# Patient Record
Sex: Female | Born: 1970 | Race: White | Hispanic: No | Smoking: Current every day smoker
Health system: Southern US, Community
[De-identification: ages and names within clinical notes are randomized; demographics above are authoritative.]

## PROBLEM LIST (undated history)

## (undated) HISTORY — PX: CHOLECYSTECTOMY: SHX55

## (undated) HISTORY — PX: TUBAL LIGATION: SHX77

## (undated) HISTORY — PX: TONSILLECTOMY: SUR1361

## (undated) HISTORY — PX: SPINE SURGERY: SHX786

## (undated) HISTORY — PX: BACK SURGERY: SHX140

## (undated) HISTORY — PX: APPENDECTOMY: SHX54

---

## 2015-01-12 ENCOUNTER — Emergency Department (HOSPITAL_COMMUNITY)
Admission: EM | Admit: 2015-01-12 | Discharge: 2015-01-12 | Disposition: A | Discharge status: Home / Self Care | Payer: Self-pay | Attending: Emergency Medicine | Admitting: Emergency Medicine

## 2015-01-12 ENCOUNTER — Encounter (HOSPITAL_COMMUNITY): Payer: Self-pay | Admitting: Emergency Medicine

## 2015-01-12 DIAGNOSIS — K029 Dental caries, unspecified: Secondary | ICD-10-CM | POA: Insufficient documentation

## 2015-01-12 DIAGNOSIS — Z720 Tobacco use: Secondary | ICD-10-CM | POA: Insufficient documentation

## 2015-01-12 DIAGNOSIS — K088 Other specified disorders of teeth and supporting structures: Secondary | ICD-10-CM | POA: Insufficient documentation

## 2015-01-12 DIAGNOSIS — K0889 Other specified disorders of teeth and supporting structures: Secondary | ICD-10-CM

## 2015-01-12 DIAGNOSIS — K0381 Cracked tooth: Secondary | ICD-10-CM | POA: Insufficient documentation

## 2015-01-12 MED ORDER — PENICILLIN V POTASSIUM 500 MG PO TABS: 500.0000 mg | ORAL_TABLET | Freq: Four times a day (QID) | ORAL | Status: AC

## 2015-01-12 MED ORDER — TRAMADOL HCL 50 MG PO TABS: 50.0000 mg | ORAL_TABLET | Freq: Four times a day (QID) | ORAL | Status: DC | PRN

## 2015-01-12 MED ORDER — BUPIVACAINE-EPINEPHRINE (PF) 0.5% -1:200000 IJ SOLN
1.8000 mL | Freq: Once | INTRAMUSCULAR | Status: AC
  Administered 2015-01-12: 1.8 mL
  Filled 2015-01-12: qty 1.8

## 2015-01-12 MED ORDER — NAPROXEN 500 MG PO TABS: 500.0000 mg | ORAL_TABLET | Freq: Two times a day (BID) | ORAL | Status: DC

## 2015-01-12 NOTE — ED Notes (Signed)
Pt states that she has a R upper tooth that broke off that is now causing her pain. Alert and oriented.

## 2015-01-12 NOTE — Discharge Instructions (Signed)
Please follow directions provided. Be sure to follow up with the dentist referral listed for further management of this tooth pain. Please take the antibiotic as directed until it all gone. Please take the naproxen twice a day to help with pain and inflammation. You may use the Ultram for pain not relieved by the naproxen. Don't hesitate to return for any new, worsening, or concerning symptoms.   SEEK IMMEDIATE MEDICAL CARE IF:  You have a fever.  You develop redness and swelling of your face, jaw, or neck.  You are unable to open your mouth.  You have severe pain uncontrolled by pain medicine.    Emergency Department Resource Guide 1) Find a Doctor and Pay Out of Pocket Although you won't have to find out who is covered by your insurance plan, it is a good idea to ask around and get recommendations. You will then need to call the office and see if the doctor you have chosen will accept you as a new patient and what types of options they offer for patients who are self-pay. Some doctors offer discounts or will set up payment plans for their patients who do not have insurance, but you will need to ask so you aren't surprised when you get to your appointment.  2) Contact Your Local Health Department Not all health departments have doctors that can see patients for sick visits, but many do, so it is worth a call to see if yours does. If you don't know where your local health department is, you can check in your phone book. The CDC also has a tool to help you locate your state's health department, and many state websites also have listings of all of their local health departments.  3) Find a [facility] If your illness is not likely to be very severe or complicated, you may want to try a walk in clinic. These are popping up all over the country in pharmacies, drugstores, and shopping centers. They're usually staffed by nurse practitioners or physician assistants that have been trained to treat  common illnesses and complaints. They're usually fairly quick and inexpensive. However, if you have serious medical issues or chronic medical problems, these are probably not your best option.  No Primary Care Doctor: - Call Health Connect at  [phone] - they can help you locate a primary care doctor that  accepts your insurance, provides certain services, etc. - Physician Referral Service- [phone]  Chronic Pain Problems: Organization         Address  Phone   Notes  [facility]  [phone] Patients need to be referred by their primary care doctor.   Medication Assistance: Organization         Address  Phone   Notes  [facility] [address] [place], [place] [address] [phone] --Must be a resident of [facility] -- Must have NO insurance coverage whatsoever (no Medicaid/ Medicare, etc.) -- The pt. MUST have a primary care doctor that directs their care regularly and follows them in the community   [facility]ist  [phone]   [employer]  [phone]    Agencies that provide inexpensive medical care: Organization         Address  Phone   Notes  [place]  [phone]   [facility]    [phone]   [facility] [place], [place] [address] [phone]  [place] [address], [place] [phone]   Planned Parenthood    [phone]   [facility]    [phone]   [place] and [address], [place] Phone:  [phone], Fax:  [phone] Hours of Operation:  9 am - 6 pm, M-F.  Also accepts Medicaid/Medicare and self-pay.  [facility] for [place] [address] [place], [address], [place] Phone: [phone], Fax: [phone]. Hours of Operation:  8:30 am - 5:30 pm, M-F.  Also accepts Medicaid and self-pay.  [facility] [address], [place] Phone: [phone]   [place], [place], [place] [phone], Ext. [phone] Mondays & Thursdays: 7-9 AM.  First 15 patients are seen on a first come, first serve basis.    [place] Providers:  Organization         Address  Phone   Notes  [facility] [address], [place] [phone] Also accepts self-pay patients.  [facility] [address], [place]  [phone]   [place], [address], [place] [phone]   [facility] [address] [place] [phone]   Lucianne Lei [address], [place]   [phone] Only accepts [place] Access [place] patients after they have their name applied to their card.   Self-Pay (no insurance) in [facility]:  Organization         Address  Phone   Notes  Sickle Cell Patients, [facility] Internal [facility] [place] [phone]   [facility] [place] [phone]   [facility] [place]  [place], [place], [place] [phone]   [facility]/Dr. Osei-Bonsu  [address] [place] or [address], [place] [phone] Phone number for both [place] and [place] locations is the same.  Urgent Medical and [facility] [address] [place] [phone]   [facility] [address], [place] or [address] [phone]   [facility] [address], [place] [phone], phone; [phone], fax Sees patients 1st and 3rd Saturday of every month.  Must not qualify for public or private insurance (i.e. Medicaid, Medicare, [place] Health Choice, Veterans' Benefits)  Household income should be no more than 200% of the  poverty level The clinic cannot treat you if you are pregnant or think you are pregnant  Sexually transmitted diseases are not treated at the clinic.    Dental Care: Organization         Address  Phone  Notes  [facility] [place] [phone] Accepts children up to age 31 who are enrolled in [place] or [place]; pregnant women with a Medicaid card; and children who have applied for Medicaid or [place] Health Choice, but were declined, whose parents can pay a reduced fee at time of service.  [facility]  [address], [place] [phone] Accepts children up to age 69 who are enrolled in [place] or [place]; pregnant women with a  Medicaid card; and children who have applied for Medicaid or [place] Health Choice, but were declined, whose parents can pay a reduced fee at time of service.  [place]  [place] [phone] Patients are seen by appointment only. Walk-ins are not accepted. [place] will see patients 15 years of age and older. Monday - Tuesday (8am-5pm) Most Wednesdays (8:30-5pm) $30 per visit, cash only  [facility] Adult Dental Access PROGRAM  [address], [facility] [phone] Patients are seen by appointment only. Walk-ins are not accepted. [place] will see patients 78 years of age and older. One Wednesday Evening (Monthly: Volunteer Based).  $30 per visit, cash only  [place]  [phone] for adults; Children under age 24, call Graduate Pediatric Dentistry at [phone]. Children aged 31-14, please call [phone] to request a pediatric application.  Dental services are provided in all areas of dental care including fillings, crowns and bridges, complete and partial dentures, implants, gum treatment, root canals, and extractions.  Preventive care is also provided. Treatment is provided to both adults and children. Patients are selected via a lottery and there is often a waiting list.   [facility] [address] [place]  [phone] [email or web address]   [facility] [address] [place], [place] [phone], Ext. [phone] Second and Fourth Thursday of each month, opens at 6:30 AM; Clinic ends at 9 AM.  Patients are seen on a first-come first-served basis, and a limited number are seen during each clinic.   [facility]  [address] Hillard Danker [place], [place] [phone]   Eligibility Requirements You must have lived in [place], [place], or [place] counties for at least the last three months.   You cannot be eligible for state or federal sponsored [employer], including [employer], [place], or [employer].   You generally cannot be eligible for healthcare insurance through your employer.    How to apply: Eligibility screenings are held every Tuesday and Wednesday afternoon from 1:00 pm until 4:00 pm. You do not need an appointment for the interview!  [place] [address], [place], [place]   [place]  [place] Department  [place]  [phone]    Behavioral Health Resources in the Community: Intensive Outpatient Programs Organization         Address  Phone  Notes  [place]. [address] [place], [place] [phone]   [facility] [address] [place], [place]   ADS: Alcohol & Drug Svcs [address] [place], [place]   [place] [address],  [place], [place] or [phone]   Substance Abuse Resources Organization         Address  Phone  Notes  Alcohol and Drug Services  [phone]   [place]  [phone]   The [place]   Chinita Pester  [phone]   Residential & Outpatient Substance Abuse Program  [phone]   Psychological Services Organization         Address  Phone  Notes  [facility] [place]  [place]  [phone]   [place] [address] [place] or [phone]    Mobile Crisis Teams Organization  Address  Phone  Notes  [facility], Mobile Crisis Care Unit  [phone]   [facility]ive Psychotherapeutic Services  [address] [place], [place]   [facility] [address] [place] [phone]    Self-Help/Support Groups Organization         Address  Phone             Notes  [place]. of [place] - variety of support groups  [place] Call for more information  Narcotics Anonymous (NA), [facility]aring [facility] [address], [employer] [place]  2 meetings at this location   Special educational needs teacher         Address  Phone  Notes  [facility] [place],    [place]  [phone]   [facility]  [address] [place] [address], [place], [place]   [facility], [place] [phone] Admissions: 8am-3pm M-F  Incentives Substance [place] [address]    [place], [place] [phone]   [facility] [address] [place], [place], [place]   [facility] [address]  [place], [place]   [facility]ight Programs - Intensive Outpatient [address] Kristeen [address], [place], [place]   [facility] ([place].) [place].,  [place], [place] [phone] or [phone]   [facility]idential Treatment Services (RTS) [address] [place], [place] Accepts Medicaid  [facility] [place] [address]  [place]  [phone] Substance Abuse/Addiction Treatment   [facility] Organization         Address  Phone  Notes  [facility]  [phone]   Domenic Schwab, PhD [address] Arlis [place], [place]   [phone] or [phone]   [place], [place] [phone]   [facility] [address] [place], [place] [phone] Insurance/Medicaid/sponsorship through [facility] and Families [address], [address] [place]                                    [place], [place] [phone] [place] [address], [place] [phone]    Dr. Adele Schilder  [phone]   [facility] [place] Dept. 1) [address], [place] 2) [place] 3)  [place] [address], [place] [phone]  [phone]   [place] [phone] or [phone] (After Hours)

## 2015-01-12 NOTE — ED Provider Notes (Signed)
CSN: [id number]     Arrival date & time 01/12/15  2017 History  This chart was scribed for Britt Bottom, NP, working with Pamella Pert, MD by Steva Colder, ED Scribe. The patient was seen in room WTR6/WTR6 at 8:53 PM.   Chief Complaint  Patient presents with  . Dental Pain      The history is provided by the patient. No language interpreter was used.    HPI Comments: Julia Wilcox is a 44 y.o. female who presents to the Emergency Department complaining of right upper dental pain onset 2 days. Pt notes that she has a tooth that is broken off and it is now causing her to have pain. Pt reports that her nerves from the tooth is now showing. Pt has had pain to this tooth in the past but she has not had pain that was this intense. Pt reports that she just moved here from [place] and she doesn't have a local dentist. Pt rates her pain as 7-8/10.  She denies fever, chills, congestion, cough, facial swelling, and any other symptoms.    History reviewed. No pertinent past medical history. Past Surgical History  Procedure Laterality Date  . Back surgery    . Appendectomy    . Cholecystectomy    . Tonsillectomy    . Tubal ligation     No family history on file. History  Substance Use Topics  . Smoking status: Current Every Day Smoker  . Smokeless tobacco: Not on file  . Alcohol Use: No   OB History    No data available     Review of Systems  Constitutional: Negative for fever and chills.  HENT: Positive for dental problem. Negative for congestion, ear pain and facial swelling.       Allergies  Dilaudid  Home Medications   Prior to Admission medications   Not on File   BP 143/94 mmHg  Pulse 88  Temp(Src) 98.8 F (37.1 C) (Oral)  SpO2 100%  LMP 01/06/2015 (Approximate)  Physical Exam  Constitutional: She is oriented to person, place, and time. She appears well-developed and well-nourished. No distress.  HENT:  Head: Normocephalic and atraumatic.  Fracture  to the anterior aspect of the right upper second molar. No pulp or nerve noted.  Eyes: Conjunctivae are normal.  Neck: Neck supple. No tracheal deviation present.  Cardiovascular: Normal rate.   Pulmonary/Chest: Effort normal. No respiratory distress.  Musculoskeletal: Normal range of motion.  Neurological: She is alert and oriented to person, place, and time.  Skin: Skin is warm and dry.  Psychiatric: She has a normal mood and affect. Her behavior is normal.  Nursing note and vitals reviewed.   ED Course  Procedures (including critical care time) DIAGNOSTIC STUDIES: Oxygen Saturation is 100% on RA, nl by my interpretation.    COORDINATION OF CARE: 8:57 PM-Discussed treatment plan which includes abx Rx, anti-inflammatory Rx, pain medication Rx, and referral to dentist with pt at bedside and pt agreed to plan.   Labs Review Labs Reviewed - No data to display  Imaging Review No results found.   EKG Interpretation None      MDM   Final diagnoses:  Pain, dental   44 yo  with toothache, dental caries and dental fracture.  No indication of abscess Ludwig's angina or spread of infection.  Pain resolved after dental block. Prescription for abx, NSAIDs and ultram provided. Pt is well-appearing, in no acute distress and vital signs reviewed and not concerning. She appears safe  to be discharged.  Discharge include follow-up with dental referral. Return precautions provided. Pt aware of plan and in agreement.   I personally performed the services described in this documentation, which was scribed in my presence. The recorded information has been reviewed and is accurate.  Filed Vitals:   01/12/15 2026  BP: 143/94  Pulse: 88  Temp: 98.8 F (37.1 C)  TempSrc: Oral  SpO2: 100%   Meds given in ED:  Medications  bupivacaine-epinephrine (MARCAINE W/ EPI) 0.5% -1:200000 injection 1.8 mL (1.8 mLs Infiltration Given by Other 01/12/15 2131)    Discharge Medication List as of 01/12/2015   9:55 PM    START taking these medications   Details  naproxen (NAPROSYN) 500 MG tablet Take 1 tablet (500 mg total) by mouth 2 (two) times daily., Starting 01/12/2015, Until Discontinued, Print    penicillin v potassium (VEETID) 500 MG tablet Take 1 tablet (500 mg total) by mouth 4 (four) times daily., Starting 01/12/2015, Until Wed 01/19/15, Print    traMADol (ULTRAM) 50 MG tablet Take 1 tablet (50 mg total) by mouth every 6 (six) hours as needed., Starting 01/12/2015, Until Discontinued, Print          Britt Bottom, NP 01/13/15 [place], MD 01/13/15 [phone]

## 2017-10-18 ENCOUNTER — Ambulatory Visit (INDEPENDENT_AMBULATORY_CARE_PROVIDER_SITE_OTHER): Payer: Managed Care, Other (non HMO) | Admitting: Physician Assistant

## 2017-10-18 ENCOUNTER — Encounter: Payer: Self-pay | Admitting: Physician Assistant

## 2017-10-18 ENCOUNTER — Other Ambulatory Visit: Payer: Self-pay

## 2017-10-18 VITALS — BP 102/60 | HR 104 | Temp 98.2°F | Resp 18 | Ht 64.0 in | Wt 159.2 lb

## 2017-10-18 DIAGNOSIS — G479 Sleep disorder, unspecified: Secondary | ICD-10-CM

## 2017-10-18 DIAGNOSIS — Z91030 Bee allergy status: Secondary | ICD-10-CM | POA: Insufficient documentation

## 2017-10-18 DIAGNOSIS — R630 Anorexia: Secondary | ICD-10-CM

## 2017-10-18 DIAGNOSIS — F4321 Adjustment disorder with depressed mood: Secondary | ICD-10-CM | POA: Diagnosis not present

## 2017-10-18 MED ORDER — CLONAZEPAM 0.5 MG PO TABS: 0.2500 mg | ORAL_TABLET | Freq: Two times a day (BID) | ORAL | 0 refills | Status: DC | PRN

## 2017-10-18 NOTE — Progress Notes (Signed)
   Julia Wilcox  MRN: [id number] DOB: 1970-09-22  PCP: No primary care provider on file.  Chief Complaint  Patient presents with  . Establish Care    discuss counseling from husband passing away     Subjective:  Pt presents to clinic for concerns about not being able to sleep or eat since her husband died unexpectedly on 10/21/2022.  She woke up in the am and found her husband dead in the hallway from a massive heart attack.  She called 911 and they had her perform CPR on him.  Oliver Pila - weekly appts - set up through [employer] at work and that has helped.  She has FMLA paperwork and STD disability paperwork that Butch Penny has filled out that she also needs to be sign.  She tried to work for a few days but they it got to much - she feels supported by work family.  She has no family in the area.  Few friends outside of work.    History is obtained by patient.  Review of Systems  Constitutional: Positive for appetite change (decreased).  Psychiatric/Behavioral: Positive for sleep disturbance.    Patient Active Problem List   Diagnosis Date Noted  . 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 EPINEPHrine (EPIPEN IJ) Inject as directed.     No current facility-administered medications on file prior to visit.     Allergies  Allergen Reactions  . Bee Venom Anaphylaxis  . Dilaudid [Hydromorphone Hcl]     History reviewed. No pertinent past medical history. Social History   Social History Narrative   Husband - died in night of massive MI - 10-11-16   Works at [employer] Use  . Smoking status: Current Every Day Smoker  . Smokeless tobacco: Never Used  Substance Use Topics  . Alcohol use: No  . Drug use: No   family history includes Diabetes in her maternal grandmother, mother, and sister; Hypertension in her mother and sister; Stroke in her maternal grandmother and  mother.     Objective:  BP 102/60   Pulse (!) 104   Temp 98.2 F (36.8 C) (Oral)   Resp 18   Ht 5' 4"  (1.626 m)   Wt 159 lb 3.2 oz (72.2 kg)   SpO2 100%   BMI 27.33 kg/m  Body mass index is 27.33 kg/m.  Physical Exam  Constitutional: She is oriented to person, place, and time and well-developed, well-nourished, and in no distress.  HENT:  Head: Normocephalic and atraumatic.  Right Ear: Hearing and external ear normal.  Left Ear: Hearing and external ear normal.  Eyes: Conjunctivae are normal.  Neck: Normal range of motion.  Cardiovascular: Normal rate, regular rhythm and normal heart sounds.  No murmur heard. Pulmonary/Chest: Effort normal and breath sounds normal. She has no wheezes.  Neurological: She is alert and oriented to person, place, and time. Gait normal.  Skin: Skin is warm and dry.  Psychiatric: Mood, memory, affect and judgment normal.  Tearful through most of visit.  Vitals reviewed.   Assessment and Plan :  Sleep disturbance  Grief reaction - Plan: clonazePAM (KLONOPIN) 0.5 MG tablet  Decreased appetite  Pt to continue grief counseling.  Will give her something to help her sleep. Support her FMLA and STD.  Windell Hummingbird PA-C  [facility] 10/21/2017 10:34 AM

## 2017-10-18 NOTE — Patient Instructions (Addendum)
Continue counseling. We will fill out your paperwork and get it sent to your work.    IF you received an x-ray today, you will receive an invoice from [facility]. Please contact [facility] at [phone] with questions or concerns regarding your invoice.   IF you received labwork today, you will receive an invoice from [place]. Please contact [facility] at [phone] with questions or concerns regarding your invoice.   Our billing staff will not be able to assist you with questions regarding bills from these companies.  You will be contacted with the lab results as soon as they are available. The fastest way to get your results is to activate your My Chart account. Instructions are located on the last page of this paperwork. If you have not heard from [place] regarding the results in 2 weeks, please contact this office.

## 2017-10-21 ENCOUNTER — Encounter: Payer: Self-pay | Admitting: Physician Assistant

## 2017-10-22 ENCOUNTER — Telehealth: Payer: Self-pay | Admitting: Physician Assistant

## 2017-10-22 NOTE — Telephone Encounter (Signed)
Patient needs disability forms completed by Judson Roch for her last OV for grief. I have completed what I could from the [place] notes and highlighted the areas I was not sure about. I will place the forms in Sarah's box on 10/22/17 please return to the FMLA/Disability desk within 5-7 business days, thank you!

## 2017-10-23 ENCOUNTER — Telehealth: Payer: Self-pay | Admitting: Physician Assistant

## 2017-10-23 ENCOUNTER — Telehealth: Payer: Self-pay

## 2017-10-23 NOTE — Telephone Encounter (Signed)
Please advise 

## 2017-10-23 NOTE — Telephone Encounter (Signed)
Copied from [place]. Topic: Quick Communication - See Telephone Encounter >> Oct 23, 2017  9:02 AM Julia Wilcox, RMA wrote: CRM for notification. See Telephone encounter for:   10/23/17.pt would like a call to discuss changing a medication change clonazepam pt stated that she is having bad dreams and would like to be placed on something else Please call pt [phone] and pt pharmacy is [facility] on [address]

## 2017-10-23 NOTE — Telephone Encounter (Signed)
Copied from [place]. Topic: Quick Communication - See Telephone Encounter >> Oct 23, 2017  9:02 AM Lolita Rieger, RMA wrote: CRM for notification. See Telephone encounter for:   10/23/17.pt would like a call to discuss changing a medication change clonazepam pt stated that she is having bad dreams and would like to be placed on something else Please call pt [phone] and pt pharmacy is [facility] on [address]

## 2017-10-24 NOTE — Telephone Encounter (Signed)
It is scanned under media under the blank FMLA forms--

## 2017-10-24 NOTE — Telephone Encounter (Signed)
Where is that information that the counselor sent to me - if it went in the regular scan I need it to be able to fill out this paperwork and it is not in there yet.

## 2017-10-24 NOTE — Telephone Encounter (Signed)
An OV will be needed for medication change.

## 2017-10-25 NOTE — Telephone Encounter (Signed)
Thank you - I thought I was supposed to sign those forms that were already filled out but when I actually read them I think I was supposed to do exactly what you had me do -- that is why you are the expert.  The forms are on your desk

## 2017-10-25 NOTE — Telephone Encounter (Signed)
Left message on patient vm that OV is needed for medication change.

## 2017-10-28 NOTE — Telephone Encounter (Signed)
Paperwork scanned and faxed on 10/28/17

## 2017-10-29 ENCOUNTER — Encounter: Payer: Self-pay | Admitting: Physician Assistant

## 2017-10-29 ENCOUNTER — Ambulatory Visit: Payer: Managed Care, Other (non HMO) | Admitting: Physician Assistant

## 2017-10-29 ENCOUNTER — Other Ambulatory Visit: Payer: Self-pay

## 2017-10-29 VITALS — BP 102/64 | HR 94 | Temp 98.5°F | Resp 18 | Ht 64.0 in | Wt 161.6 lb

## 2017-10-29 DIAGNOSIS — F4321 Adjustment disorder with depressed mood: Secondary | ICD-10-CM

## 2017-10-29 DIAGNOSIS — G479 Sleep disorder, unspecified: Secondary | ICD-10-CM | POA: Diagnosis not present

## 2017-10-29 DIAGNOSIS — R630 Anorexia: Secondary | ICD-10-CM | POA: Diagnosis not present

## 2017-10-29 NOTE — Patient Instructions (Signed)
     IF you received an x-ray today, you will receive an invoice from [facility]. Please contact [facility] at [phone] with questions or concerns regarding your invoice.   IF you received labwork today, you will receive an invoice from [facility]. Please contact [facility] at [phone] with questions or concerns regarding your invoice.   Our billing staff will not be able to assist you with questions regarding bills from these companies.  You will be contacted with the lab results as soon as they are available. The fastest way to get your results is to activate your My Chart account. Instructions are located on the last page of this paperwork. If you have not heard from us regarding the results in 2 weeks, please contact this office.     

## 2017-10-29 NOTE — Progress Notes (Signed)
Julia Wilcox  MRN: [id number] DOB: 10-Jul-1971  PCP: Patient, No Pcp Per  Chief Complaint  Patient presents with  . Follow-up    klonopin gave pt nightmares     Subjective:  Pt presents to clinic for  Triggers for panic attacks -- when she sees an ambulance with the lights on and if she is away from her house she starts to have increased heart rate and chest pain and feeling of suffocation.  These symptoms will take over an hour to resolve when they start.  Sleep has gotten better - Klonopin gave her nightmares -- she stopped it and she slept much better - she would rather use natural treatments below are some that she has tried successfully. Sleepy time tea Sleepy mediatation music Breathing exercise  Trying to be with people who bring her up and do things that relax her.    She worries that when she goes back to work that they are across the street from the ambulance bay and being away from home for that long.  History is obtained by patient.  Review of Systems  Constitutional: Positive for appetite change (decreased).  Psychiatric/Behavioral: Positive for sleep disturbance.    Patient Active Problem List   Diagnosis Date Noted  . 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 EPINEPHrine (EPIPEN IJ) Inject as directed.    . clonazePAM (KLONOPIN) 0.5 MG tablet Take 0.5-1 tablets (0.25-0.5 mg total) by mouth 2 (two) times daily as needed for anxiety. (Patient not taking: Reported on 10/29/2017) 20 tablet 0   No current facility-administered medications on file prior to visit.     Allergies  Allergen Reactions  . Bee Venom Anaphylaxis  . Dilaudid [Hydromorphone Hcl]     History reviewed. No pertinent past medical history. Social History   Social History Narrative   Husband - died in night of massive MI - 10-30-2016   Works at [employer] Use  . Smoking  status: Current Every Day Smoker  . Smokeless tobacco: Never Used  Substance Use Topics  . Alcohol use: No  . Drug use: No   family history includes Diabetes in her maternal grandmother, mother, and sister; Hypertension in her mother and sister; Stroke in her maternal grandmother and mother.     Objective:  BP 102/64   Pulse 94   Temp 98.5 F (36.9 C) (Oral)   Resp 18   Ht 5' 4"  (1.626 m)   Wt 161 lb 9.6 oz (73.3 kg)   LMP  (LMP Unknown)   SpO2 98%   BMI 27.74 kg/m  Body mass index is 27.74 kg/m.  Physical Exam  Constitutional: She is oriented to person, place, and time and well-developed, well-nourished, and in no distress.  HENT:  Head: Normocephalic and atraumatic.  Right Ear: Hearing and external ear normal.  Left Ear: Hearing and external ear normal.  Eyes: Conjunctivae are normal.  Neck: Normal range of motion.  Pulmonary/Chest: Effort normal.  Neurological: She is alert and oriented to person, place, and time. Gait normal.  Skin: Skin is warm and dry.  Psychiatric: Mood, memory, affect and judgment normal.  Vitals reviewed.  Spent 25 mins with the patient - greater than 50% of the visit was face to face counseling patient regarding current state and next step.   Assessment and Plan :  Grief reaction  Sleep disturbance  Decreased appetite  Pt is significantly better than she was at her visit last week.  She is not tearful.  She seems more consistent with her thoughts and more focused. She continues to do weekly counseling.  She will continue to be mindful of her thoughts and actions.  She short term disability will likely need to be extended.  Windell Hummingbird PA-C  [facility] 10/29/2017 1:07 PM

## 2017-10-29 NOTE — Telephone Encounter (Signed)
Duplicate encounter. Closed.

## 2017-10-30 MED ORDER — LORAZEPAM 0.5 MG PO TABS: 0.5000 mg | ORAL_TABLET | Freq: Two times a day (BID) | ORAL | 0 refills | Status: DC | PRN

## 2017-11-01 ENCOUNTER — Ambulatory Visit: Payer: Managed Care, Other (non HMO) | Admitting: Physician Assistant

## 2017-11-08 ENCOUNTER — Encounter: Payer: Self-pay | Admitting: Physician Assistant

## 2017-11-08 ENCOUNTER — Ambulatory Visit: Payer: Managed Care, Other (non HMO) | Admitting: Physician Assistant

## 2017-11-08 ENCOUNTER — Other Ambulatory Visit: Payer: Self-pay

## 2017-11-08 VITALS — BP 98/60 | HR 118 | Temp 98.8°F | Resp 18 | Ht 64.0 in | Wt 161.6 lb

## 2017-11-08 DIAGNOSIS — G479 Sleep disorder, unspecified: Secondary | ICD-10-CM

## 2017-11-08 DIAGNOSIS — F4321 Adjustment disorder with depressed mood: Secondary | ICD-10-CM

## 2017-11-08 NOTE — Progress Notes (Signed)
Julia Wilcox  MRN: [id number] DOB: [birth year]  PCP: Patient, No Pcp Per  Chief Complaint  Patient presents with  . Follow-up    Subjective:  Pt presents to clinic for recheck.  She has been doing good.  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  She has started to think that when an ambulance has its lights on that it is going to help someone and that has really helped.  She is still having a hard time being away from home for long periods of time.  She does think that she is improving.  She does know it is going to take a long time but she is starting to engage herself in things to keep her mind busy that she enjoys.  She is sleeping better.  She has continued therapy - she EAP is starting to end but she plans to continue seeing therapist with her insurance.  She spoke with Short term disabiltiy representative   History is obtained by patient.  Review of Systems  Psychiatric/Behavioral: Positive for sleep disturbance (improving - not using any  medications ). The patient is nervous/anxious (situational).     Patient Active Problem List   Diagnosis Date Noted  . 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 clonazePAM (KLONOPIN) 0.5 MG tablet Take 0.5-1 tablets (0.25-0.5 mg total) by mouth 2 (two) times daily as needed for anxiety. 20 tablet 0  . EPINEPHrine (EPIPEN IJ) Inject as directed.    Marland Kitchen LORazepam (ATIVAN) 0.5 MG tablet Take 1 tablet (0.5 mg total) by mouth 2 (two) times daily as needed for anxiety. 15 tablet 0   No current facility-administered medications on file prior to visit.     Allergies  Allergen Reactions  . Bee Venom Anaphylaxis  . Dilaudid [Hydromorphone Hcl]     No past medical history on file. Social History   Social History Narrative   Husband - died in night of massive MI - 2016/10/31   Works at [employer] Use  . Smoking status: Current Every Day Smoker  . Smokeless tobacco: Never Used  Substance Use Topics  . Alcohol use: No  . Drug use: No   family history includes Diabetes in her maternal grandmother, mother, and sister; Hypertension in her mother and sister; Stroke in her maternal grandmother and mother.     Objective:  BP 98/60   Pulse (!) 118   Temp 98.8 F (37.1 C) (Oral)   Resp 18   Ht 5' 4"  (1.626 m)   Wt 161 lb 9.6 oz (73.3 kg)   LMP  (LMP Unknown)   SpO2 98%   BMI 27.74 kg/m  Body mass index is 27.74 kg/m.  Physical Exam  Constitutional: She is oriented to person, place, and time and well-developed, well-nourished, and in no distress.  HENT:  Head: Normocephalic and atraumatic.  Right Ear: Hearing and external ear normal.  Left Ear: Hearing and external ear normal.  Eyes: Conjunctivae are normal.  Neck: Normal range of motion.  Pulmonary/Chest: Effort normal.  Neurological: She is alert and oriented to person, place, and time. Gait normal.  Skin: Skin is warm and dry.  Psychiatric: Mood, memory, affect and judgment normal.  Less tearful.  Able to talk about her husband without problems.  Vitals reviewed.  Spent 30 mins with the patient -  greater than 50% of the visit was face to face counseling patient regarding her current situation and plan for the future.   Assessment and Plan :  Grief reaction  Sleep disturbance   Continue therapy - extend short term disability - the patient continues to improve but she is not ready to be back at work. She will continue therapy as this has been helpful.  Recheck with me in 3 weeks - that will be 3 weeks before her planned return to work so we can make adjustment if needed.  She is able to go back [part days with her STD which we will likely take advantage of.  Windell Hummingbird PA-C  [facility] 11/11/2017 1:48 PM

## 2017-11-08 NOTE — Patient Instructions (Signed)
     IF you received an x-ray today, you will receive an invoice from [facility]. Please contact [facility] at [phone] with questions or concerns regarding your invoice.   IF you received labwork today, you will receive an invoice from [facility]. Please contact [facility] at [phone] with questions or concerns regarding your invoice.   Our billing staff will not be able to assist you with questions regarding bills from these companies.  You will be contacted with the lab results as soon as they are available. The fastest way to get your results is to activate your My Chart account. Instructions are located on the last page of this paperwork. If you have not heard from us regarding the results in 2 weeks, please contact this office.     

## 2017-11-11 ENCOUNTER — Encounter: Payer: Self-pay | Admitting: Physician Assistant

## 2017-11-12 NOTE — Telephone Encounter (Signed)
Please send over records for patient and then please let her know when they are faxed.

## 2017-11-12 NOTE — Telephone Encounter (Signed)
Copied from [place] [phone]. Topic: General - Other >> Nov 11, 2017  1:13 PM Burnis Medin, NT wrote: Reason for CRM: Patient called because she hasn't heard back about the doctor sending all the chart notes to insurance company. Pls send over chart notes to [phone] .Pt would like a call back.

## 2017-11-13 NOTE — Telephone Encounter (Signed)
Paperwork and records have been sent to insurance company twice already but I did resend them today 3/20

## 2017-11-27 ENCOUNTER — Telehealth: Payer: Self-pay | Admitting: Physician Assistant

## 2017-11-27 NOTE — Telephone Encounter (Signed)
Called pt and left message for her to reschedule her 4/5 apt with Windell Hummingbird due to provider being out of the office that day.

## 2017-11-29 ENCOUNTER — Ambulatory Visit (INDEPENDENT_AMBULATORY_CARE_PROVIDER_SITE_OTHER): Payer: Managed Care, Other (non HMO) | Admitting: Physician Assistant

## 2017-11-29 ENCOUNTER — Encounter: Payer: Self-pay | Admitting: Physician Assistant

## 2017-11-29 ENCOUNTER — Ambulatory Visit: Payer: Managed Care, Other (non HMO) | Admitting: Physician Assistant

## 2017-11-29 VITALS — BP 99/67 | HR 98 | Temp 98.3°F | Resp 17 | Ht 64.0 in | Wt 167.0 lb

## 2017-11-29 DIAGNOSIS — G479 Sleep disorder, unspecified: Secondary | ICD-10-CM | POA: Diagnosis not present

## 2017-11-29 DIAGNOSIS — F4321 Adjustment disorder with depressed mood: Secondary | ICD-10-CM | POA: Diagnosis not present

## 2017-11-29 NOTE — Progress Notes (Signed)
    11/29/2017 10:34 AM   DOB: [birth year] / MRN: [id number]  SUBJECTIVE:  Julia Wilcox is a 47 y.o. female presenting for clearance to go back to work.  Patient has been out on FMLA secondary to a grief reaction and sleep disturbance secondary to the sudden death of a loved one, her husband. Doing well.  Sleeping better. Used exposure therapy successfully to deal with stress associated with emergency lights and sirens. Continues to relive the sights, sounds and smells of her husbands passing.   She is allergic to bee venom and dilaudid [hydromorphone hcl].   She  has no past medical history on file.    She  reports that she has been smoking.  She has a 3.00 pack-year smoking history. She has never used smokeless tobacco. She reports that she does not drink alcohol or use drugs. She  has no sexual activity history on file. The patient  has a past surgical history that includes Back surgery; Appendectomy; Cholecystectomy; Tonsillectomy; Tubal ligation; and Spine surgery.  Her family history includes Diabetes in her maternal grandmother, mother, and sister; Hypertension in her mother and sister; Stroke in her maternal grandmother and mother.  Review of Systems  Psychiatric/Behavioral: Positive for depression. Negative for hallucinations, substance abuse and suicidal ideas. The patient is nervous/anxious. The patient does not have insomnia.     The problem list and medications were reviewed and updated by myself where necessary and exist elsewhere in the encounter.   OBJECTIVE:  BP 99/67   Pulse 98   Temp 98.3 F (36.8 C) (Oral)   Resp 17   Ht 5' 4"  (1.626 m)   Wt 167 lb (75.8 kg)   SpO2 98%   BMI 28.67 kg/m   Physical Exam  Constitutional: She is active.  Non-toxic appearance.  Cardiovascular: Normal rate.  Pulmonary/Chest: Effort normal. No tachypnea.  Neurological: She is alert.  Skin: Skin is warm and dry. She is not diaphoretic. No pallor.  Psychiatric: She has a normal  mood and affect. Her behavior is normal. Judgment and thought content normal.    No results found for this or any previous visit (from the past 72 hour(s)).  No results found.  ASSESSMENT AND PLAN:  Julia Wilcox was seen today for follow-up.  Diagnoses and all orders for this visit:  Grief reaction: See work note.  She will return to see PA Weber or myself in 1 month for clearance.  Sooner if needed.   Sleep disturbance    The patient is advised to call or return to clinic if she does not see an improvement in symptoms, or to seek the care of the closest emergency department if she worsens with the above plan.   Philis Fendt, MHS, PA-C [facility] 11/29/2017 10:34 AM

## 2017-11-29 NOTE — Patient Instructions (Signed)
     IF you received an x-ray today, you will receive an invoice from [facility]. Please contact  Radiology at [phone] with questions or concerns regarding your invoice.   IF you received labwork today, you will receive an invoice from [facility]. Please contact [facility] at [phone] with questions or concerns regarding your invoice.   Our billing staff will not be able to assist you with questions regarding bills from these companies.  You will be contacted with the lab results as soon as they are available. The fastest way to get your results is to activate your My Chart account. Instructions are located on the last page of this paperwork. If you have not heard from us regarding the results in 2 weeks, please contact this office.     

## 2017-12-27 ENCOUNTER — Other Ambulatory Visit: Payer: Self-pay

## 2017-12-27 ENCOUNTER — Ambulatory Visit: Payer: Managed Care, Other (non HMO) | Admitting: Physician Assistant

## 2017-12-27 ENCOUNTER — Encounter: Payer: Self-pay | Admitting: Physician Assistant

## 2017-12-27 VITALS — BP 113/73 | HR 97 | Temp 98.0°F | Ht 64.0 in | Wt 160.2 lb

## 2017-12-27 DIAGNOSIS — R630 Anorexia: Secondary | ICD-10-CM | POA: Diagnosis not present

## 2017-12-27 DIAGNOSIS — F4321 Adjustment disorder with depressed mood: Secondary | ICD-10-CM

## 2017-12-27 NOTE — Progress Notes (Signed)
12/27/2017 5:07 PM   DOB: [birth year] / MRN: [id number]  SUBJECTIVE:  Julia Wilcox is a 47 y.o. female presenting for follow up of grief reaction.  Per my last not patient lost her husband about three months ago. Seh feels that she is doing okay with everything. Is eating less.  No anhedonia otherwise. Endorses some increase in fatigue but contributes this to being busy around the house.  She did have 1 acute tearful episode at work and was sent home.  Has not been taking the benzodiazepine.  No SI or HI.   Depression screen PHQ 2/9 12/27/2017  Decreased Interest 0  Down, Depressed, Hopeless 0  PHQ - 2 Score 0       She is allergic to bee venom and dilaudid [hydromorphone hcl].   She  has no past medical history on file.    She  reports that she has been smoking.  She has a 3.00 pack-year smoking history. She has never used smokeless tobacco. She reports that she does not drink alcohol or use drugs. She  has no sexual activity history on file. The patient  has a past surgical history that includes Back surgery; Appendectomy; Cholecystectomy; Tonsillectomy; Tubal ligation; and Spine surgery.  Her family history includes Diabetes in her maternal grandmother, mother, and sister; Hypertension in her mother and sister; Stroke in her maternal grandmother and mother.  Review of Systems  Psychiatric/Behavioral: Negative for depression, hallucinations, memory loss, substance abuse and suicidal ideas. The patient is nervous/anxious. The patient does not have insomnia.     The problem list and medications were reviewed and updated by myself where necessary and exist elsewhere in the encounter.   OBJECTIVE:  BP 113/73 (BP Location: Left Arm, Patient Position: Sitting, Cuff Size: Normal)   Pulse 97   Temp 98 F (36.7 C) (Oral)   Ht 5' 4"  (1.626 m)   Wt 160 lb 3.2 oz (72.7 kg)   SpO2 97%   BMI 27.50 kg/m   Physical Exam  Constitutional: She is oriented to person, place, and time. She  appears well-nourished. No distress.  Eyes: Pupils are equal, round, and reactive to light. EOM are normal.  Cardiovascular: Normal rate, regular rhythm, S1 normal, S2 normal, normal heart sounds and intact distal pulses. Exam reveals no gallop, no friction rub and no decreased pulses.  No murmur heard. Pulmonary/Chest: Effort normal. No stridor. No respiratory distress. She has no wheezes. She has no rales.  Abdominal: She exhibits no distension.  Musculoskeletal: She exhibits no edema.  Neurological: She is alert and oriented to person, place, and time. No cranial nerve deficit. Gait normal.  Skin: Skin is dry. She is not diaphoretic.  Psychiatric: She has a normal mood and affect. Her mood appears not anxious. Her affect is not angry, not blunt, not labile and not inappropriate. Her speech is not rapid and/or pressured, not delayed and not tangential. She is not slowed, not withdrawn and not actively hallucinating. Cognition and memory are not impaired. She does not express impulsivity or inappropriate judgment. She does not exhibit a depressed mood. She expresses no homicidal and no suicidal ideation. She expresses no suicidal plans and no homicidal plans. She exhibits normal recent memory and normal remote memory. She is attentive.  Vitals reviewed.   No results found for this or any previous visit (from the past 72 hour(s)).  No results found.  ASSESSMENT AND PLAN:  [name] was seen today for grief.  Diagnoses and all orders for this  visit:  Grief reaction: I would like the patient to continue working 6 hours a day only and to come back in about 2 weeks for recheck.  She is losing her appetite.  I do wonder if she has developing a more long-term depression versus grief reaction.  She is not taking any of the benzodiazepine.  I have encouraged her to remain open to medications and future appointments as I may really improve her mood.  Appetite loss    The patient is advised to call  or return to clinic if she does not see an improvement in symptoms, or to seek the care of the closest emergency department if she worsens with the above plan.   Philis Fendt, MHS, PA-C [facility] 12/27/2017 5:07 PM

## 2017-12-27 NOTE — Patient Instructions (Addendum)
  Come back and see Bernestine Amass or myself at anytime you feel you need anything.    IF you received an x-ray today, you will receive an invoice from [facility]. Please contact [facility] at [phone] with questions or concerns regarding your invoice.   IF you received labwork today, you will receive an invoice from [facility]. Please contact [facility] at [phone] with questions or concerns regarding your invoice.   Our billing staff will not be able to assist you with questions regarding bills from these companies.  You will be contacted with the lab results as soon as they are available. The fastest way to get your results is to activate your My Chart account. Instructions are located on the last page of this paperwork. If you have not heard from [place] regarding the results in 2 weeks, please contact this office.

## 2018-01-10 ENCOUNTER — Encounter: Payer: Self-pay | Admitting: Physician Assistant

## 2018-01-10 ENCOUNTER — Ambulatory Visit: Payer: Managed Care, Other (non HMO) | Admitting: Physician Assistant

## 2018-01-10 VITALS — BP 101/68 | HR 88 | Temp 98.7°F | Resp 17 | Ht 64.5 in | Wt 159.0 lb

## 2018-01-10 DIAGNOSIS — F4310 Post-traumatic stress disorder, unspecified: Secondary | ICD-10-CM

## 2018-01-10 MED ORDER — EPINEPHRINE 0.15 MG/0.3ML IJ SOAJ: 0.1500 mg | INTRAMUSCULAR | 1 refills | Status: DC | PRN

## 2018-01-10 MED ORDER — ESCITALOPRAM OXALATE 5 MG PO TABS: 5.0000 mg | ORAL_TABLET | Freq: Every day | ORAL | 1 refills | Status: DC

## 2018-01-10 NOTE — Progress Notes (Signed)
01/10/2018 9:28 AM   DOB: [birth year] / MRN: [id number]  SUBJECTIVE:  Julia Wilcox is a 47 y.o. female presenting for recheck of grief reaction.  Patient lost her husband in early February suddenly.  He is 47 years old.  I been seeing her now for roughly 2 months and it was felt that she had been doing well with counseling alone.  Today she tells me that she will be alone at home and is now starting to see and hear things happening in the home despite no one else being in the home.  Tells me she heard a dresser drawer open while she was lying in bed.  Tells me the Julia Wilcox will click on by itself.  Tells me that her dogs will get in the circle and howl.  She feels that her husband is with her in the home and is performing these ask and is trying to let her know that he is still with her and that he is okay and that she is okay.  She denies any thoughts of harming herself or others at this point.  She remains in counseling.   She is allergic to bee venom and dilaudid [hydromorphone hcl].   She  has no past medical history on file.    She  reports that she has been smoking.  She has a 3.00 pack-year smoking history. She has never used smokeless tobacco. She reports that she does not drink alcohol or use drugs. She  has no sexual activity history on file. The patient  has a past surgical history that includes Back surgery; Appendectomy; Cholecystectomy; Tonsillectomy; Tubal ligation; and Spine surgery.  Her family history includes Diabetes in her maternal grandmother, mother, and sister; Hypertension in her mother and sister; Stroke in her maternal grandmother and mother.  Review of Systems  Psychiatric/Behavioral: Positive for depression and hallucinations. Negative for memory loss, substance abuse and suicidal ideas. The patient is nervous/anxious and has insomnia.     The problem list and medications were reviewed and updated by myself where necessary and exist elsewhere in the encounter.    OBJECTIVE:  BP 101/68   Pulse 88   Temp 98.7 F (37.1 C) (Oral)   Resp 17   Ht 5' 4.5" (1.638 m)   Wt 159 lb (72.1 kg)   SpO2 98%   BMI 26.87 kg/m   Physical Exam  Constitutional: She is oriented to person, place, and time. She appears well-nourished. No distress.  Eyes: Pupils are equal, round, and reactive to light. EOM are normal.  Cardiovascular: Normal rate, regular rhythm, S1 normal, S2 normal, normal heart sounds and intact distal pulses. Exam reveals no gallop, no friction rub and no decreased pulses.  No murmur heard. Pulmonary/Chest: Effort normal. No stridor. No respiratory distress. She has no wheezes. She has no rales.  Abdominal: She exhibits no distension.  Musculoskeletal: She exhibits no edema.  Neurological: She is alert and oriented to person, place, and time. No cranial nerve deficit. Gait normal.  Skin: Skin is dry. She is not diaphoretic.  Psychiatric: Her behavior is normal. Judgment normal. Her mood appears not anxious. Her affect is not angry, not blunt, not labile and not inappropriate. Her speech is not rapid and/or pressured, not delayed, not tangential and not slurred. She is not agitated, not aggressive, not hyperactive, not slowed, not withdrawn, not actively hallucinating and not combative. She does not express impulsivity or inappropriate judgment. She exhibits a depressed mood. She expresses no homicidal and no  suicidal ideation. She expresses no suicidal plans and no homicidal plans. She is communicative. She exhibits normal recent memory and normal remote memory. She is attentive.  Vitals reviewed.   No results found for this or any previous visit (from the past 72 hour(s)).  No results found.  ASSESSMENT AND PLAN:  [name] was seen today for follow-up.  Diagnoses and all orders for this visit:  PTSD (post-traumatic stress disorder):She needs an SSRI.Starting low medium dose of Lexapro and we will see her back in 2 weeks.  I encouraged her  to take Klonopin as needed for increased anxiousness and panic symptoms.  Advised that she can not increase her work at this time.  We will keep her at 6 hours.  Other orders -     escitalopram (LEXAPRO) 5 MG tablet; Take 1 tablet (5 mg total) by mouth daily. Take with food.    The patient is advised to call or return to clinic if she does not see an improvement in symptoms, or to seek the care of the closest emergency department if she worsens with the above plan.   Philis Fendt, MHS, PA-C [facility] 01/10/2018 9:28 AM

## 2018-01-10 NOTE — Patient Instructions (Addendum)
Please take the Lexapro daily and try not to miss any doses.  If you feeling anxiousness or panic is okay to take a Klonopin as prescribed and I encourage you to take this medication.    IF you received an x-ray today, you will receive an invoice from [facility]. Please contact [facility] at [phone] with questions or concerns regarding your invoice.   IF you received labwork today, you will receive an invoice from [facility]. Please contact [facility] at [phone] with questions or concerns regarding your invoice.   Our billing staff will not be able to assist you with questions regarding bills from these companies.  You will be contacted with the lab results as soon as they are available. The fastest way to get your results is to activate your My Chart account. Instructions are located on the last page of this paperwork. If you have not heard from [place] regarding the results in 2 weeks, please contact this office.

## 2018-01-13 ENCOUNTER — Telehealth: Payer: Self-pay | Admitting: Physician Assistant

## 2018-01-13 MED ORDER — EPINEPHRINE 0.3 MG/0.3ML IJ SOAJ
0.3000 mg | Freq: Once | INTRAMUSCULAR | 1 refills | Status: AC
Start: 2018-01-13 — End: 2018-01-13

## 2018-01-13 NOTE — Telephone Encounter (Signed)
Copied from [place] [id number]. Topic: Quick Communication - See Telephone Encounter >> Jan 13, 2018  1:31 PM Ether Griffins B wrote: CRM for notification. See Telephone encounter for: 01/13/18.  [place] with [employer] calling to verify that Legrand Como wanted the pt to have the EPINEPHrine (EPIPEN JR 2-PAK) 0.15 MG/0.3ML injection. She states it is usually for children. CB# [phone]

## 2018-01-14 NOTE — Telephone Encounter (Signed)
I've sent in the proper medication.  Thank you. Philis Fendt, MS, PA-C 4:36 PM, 01/14/2018

## 2018-01-23 ENCOUNTER — Telehealth: Payer: Self-pay | Admitting: Physician Assistant

## 2018-01-23 NOTE — Telephone Encounter (Signed)
Copied from [place] [id number] [id number]. Topic: General - Other >> Dec 27, 2017 12:59 PM Vernona Rieger wrote: Reason for CRM: Patient wants to make sure her short term disability is sent. She needs the chart notes sent from today's visit.  Fax number [phone] >> Jan 23, 2018  9:36 AM Cleaster Corin, NT wrote: Pt. Calling back to check on the status of paperwork .  Patient wants to make sure her short term disability is sent. She needs the chart notes sent from 12/27/17 visit.   Fax number [phone]

## 2018-01-23 NOTE — Telephone Encounter (Signed)
Phone message sent to [place] re: short term disability.

## 2018-01-23 NOTE — Telephone Encounter (Signed)
CRM has already been answered--paperwork was faxed in on 12/30/17 along with notes

## 2018-01-24 ENCOUNTER — Encounter: Payer: Self-pay | Admitting: Physician Assistant

## 2018-01-24 ENCOUNTER — Ambulatory Visit: Payer: Managed Care, Other (non HMO) | Admitting: Physician Assistant

## 2018-01-24 VITALS — BP 101/68 | HR 84 | Temp 98.0°F | Resp 17 | Ht 64.5 in | Wt 158.0 lb

## 2018-01-24 DIAGNOSIS — F4310 Post-traumatic stress disorder, unspecified: Secondary | ICD-10-CM

## 2018-01-24 NOTE — Progress Notes (Signed)
01/24/2018 1:42 PM   DOB: [birth year] / MRN: [id number]  SUBJECTIVE:  Julia Wilcox is a 47 y.o. female presenting for recheck of PTSD and grief secondary to the loss of her husband.  Patient was taking SSRI however this made her feel nauseous and gave her diarrhea and that she stopped.  Since her last appointment she tells me that "I think I was just having a bad week the last time we met."  She continues to see her counselor who feels she is doing well and progressing.  Patient feels that she is ready to go back to work full-time without any hourly restriction.  She denies SI and HI and has never felt that way.  She reports that she is also sleeping better.  She tells me that she is not opposed to taking a benzodiazepine in times of severe stress.  She is allergic to bee venom and dilaudid [hydromorphone hcl].   She  has no past medical history on file.    She  reports that she has been smoking.  She has a 3.00 pack-year smoking history. She has never used smokeless tobacco. She reports that she does not drink alcohol or use drugs. She  has no sexual activity history on file. The patient  has a past surgical history that includes Back surgery; Appendectomy; Cholecystectomy; Tonsillectomy; Tubal ligation; and Spine surgery.  Her family history includes Diabetes in her maternal grandmother, mother, and sister; Hypertension in her mother and sister; Stroke in her maternal grandmother and mother.  Review of Systems  Constitutional: Negative for chills, diaphoresis and fever.  Gastrointestinal: Negative for nausea.  Skin: Negative for rash.  Neurological: Negative for dizziness.  Psychiatric/Behavioral: Negative for depression, hallucinations, memory loss, substance abuse and suicidal ideas. The patient is not nervous/anxious and does not have insomnia.     The problem list and medications were reviewed and updated by myself where necessary and exist elsewhere in the encounter.    OBJECTIVE:  BP 101/68   Pulse 84   Temp 98 F (36.7 C) (Oral)   Resp 17   Ht 5' 4.5" (1.638 m)   Wt 158 lb (71.7 kg)   SpO2 98%   BMI 26.70 kg/m   Physical Exam  Constitutional: She is oriented to person, place, and time. She appears well-nourished. No distress.  Eyes: Pupils are equal, round, and reactive to light. EOM are normal.  Cardiovascular: Normal rate.  Pulmonary/Chest: Effort normal.  Abdominal: She exhibits no distension.  Neurological: She is alert and oriented to person, place, and time. No cranial nerve deficit. Gait normal.  Skin: Skin is dry. She is not diaphoretic.  Psychiatric: She has a normal mood and affect. Her behavior is normal. Judgment and thought content normal.  Vitals reviewed.   No results found for this or any previous visit (from the past 72 hour(s)).  No results found.  ASSESSMENT AND PLAN:  Julia Wilcox was seen today for follow-up.  Diagnoses and all orders for this visit:  PTSD (post-traumatic stress disorder): Patient seems to be doing much better today.  I will release her back to work without any restrictions and have advised that she come back in about a month for recheck of her symptoms.    The patient is advised to call or return to clinic if she does not see an improvement in symptoms, or to seek the care of the closest emergency department if she worsens with the above plan.   Julia Wilcox, MHS, PA-C [facility] 01/24/2018 1:42 PM

## 2018-01-24 NOTE — Patient Instructions (Signed)
     IF you received an x-ray today, you will receive an invoice from [facility]. Please contact [facility] at [phone] with questions or concerns regarding your invoice.   IF you received labwork today, you will receive an invoice from [facility]. Please contact [facility] at [phone] with questions or concerns regarding your invoice.   Our billing staff will not be able to assist you with questions regarding bills from these companies.  You will be contacted with the lab results as soon as they are available. The fastest way to get your results is to activate your My Chart account. Instructions are located on the last page of this paperwork. If you have not heard from us regarding the results in 2 weeks, please contact this office.     

## 2018-01-30 ENCOUNTER — Encounter: Payer: Self-pay | Admitting: Obstetrics & Gynecology

## 2018-02-03 ENCOUNTER — Telehealth: Payer: Self-pay | Admitting: Physician Assistant

## 2018-02-03 NOTE — Telephone Encounter (Signed)
Copied from [place] [id number]. Topic: Quick Communication - See Telephone Encounter >> Feb 03, 2018  1:05 PM Rutherford Nail, NT wrote: CRM for notification. See Telephone encounter for: 02/03/18. Patient calling and states that insurance has not received office notes from 01/24/18. Needs these for short term disability with [employer]. Please advise. CB#: [phone]

## 2018-02-04 NOTE — Telephone Encounter (Signed)
Records faxed over on 6/11

## 2018-02-05 ENCOUNTER — Telehealth: Payer: Self-pay

## 2018-02-05 NOTE — Telephone Encounter (Signed)
Copied from [place] [id number]. Topic: General - Other >> Feb 05, 2018  8:53 AM Synthia Innocent wrote: Reason for CRM: Patient calling and requesting intermittent FMLA, can just be written on prescription pad. Please advise. Missed work today due to grief.   Message sent to Philis Fendt PA

## 2018-02-07 NOTE — Telephone Encounter (Signed)
Please start processing claim. Suggest up to 8 hours per week as needed for the next six months. Please call pt and let her know we are working on this request.

## 2018-02-10 ENCOUNTER — Other Ambulatory Visit: Payer: Self-pay

## 2018-02-10 ENCOUNTER — Ambulatory Visit: Payer: Managed Care, Other (non HMO) | Admitting: Physician Assistant

## 2018-02-10 VITALS — BP 115/76 | HR 99 | Temp 98.1°F | Ht 63.78 in | Wt 162.2 lb

## 2018-02-10 DIAGNOSIS — F4310 Post-traumatic stress disorder, unspecified: Secondary | ICD-10-CM | POA: Diagnosis not present

## 2018-02-10 DIAGNOSIS — F4321 Adjustment disorder with depressed mood: Secondary | ICD-10-CM | POA: Diagnosis not present

## 2018-02-10 NOTE — Patient Instructions (Signed)
     IF you received an x-ray today, you will receive an invoice from [facility]. Please contact [facility] at [phone] with questions or concerns regarding your invoice.   IF you received labwork today, you will receive an invoice from [facility]. Please contact [facility] at [phone] with questions or concerns regarding your invoice.   Our billing staff will not be able to assist you with questions regarding bills from these companies.  You will be contacted with the lab results as soon as they are available. The fastest way to get your results is to activate your My Chart account. Instructions are located on the last page of this paperwork. If you have not heard from us regarding the results in 2 weeks, please contact this office.     

## 2018-02-11 NOTE — Progress Notes (Signed)
02/11/2018 10:38 AM   DOB: 1970/11/12 / MRN: [id number]  SUBJECTIVE:  Julia Wilcox is a 47 y.o. female presenting for prolonged grief reaction causing depression and anxiety.  Patient tells me that she had "an episode," while at work the other day and had to leave early.  She feels overwhelmed sadness.  I have prescribed her Lexapro in the past along with Klonopin however she does not want to take medications but now feels that she needs help.  She does have the help of a psychotherapist and continues to go to sessions.  She would like for me to go over her medications with her today to ensure that she is taking these correctly.  She has follow-up established in roughly 2 weeks.  She is allergic to bee venom and dilaudid [hydromorphone hcl].   She  has no past medical history on file.    She  reports that she has been smoking.  She has a 3.00 pack-year smoking history. She has never used smokeless tobacco. She reports that she does not drink alcohol or use drugs. She  has no sexual activity history on file. The patient  has a past surgical history that includes Back surgery; Appendectomy; Cholecystectomy; Tonsillectomy; Tubal ligation; and Spine surgery.  Her family history includes Diabetes in her maternal grandmother, mother, and sister; Hypertension in her mother and sister; Stroke in her maternal grandmother and mother.  Review of Systems  Psychiatric/Behavioral: Positive for depression. Negative for hallucinations, memory loss, substance abuse and suicidal ideas. The patient is nervous/anxious and has insomnia.     The problem list and medications were reviewed and updated by myself where necessary and exist elsewhere in the encounter.   OBJECTIVE:  BP 115/76 (BP Location: Left Arm, Patient Position: Sitting, Cuff Size: Normal)   Pulse 99   Temp 98.1 F (36.7 C) (Oral)   Ht 5' 3.78" (1.62 m)   Wt 162 lb 3.2 oz (73.6 kg)   LMP 12/11/2017   SpO2 99%   BMI 28.03 kg/m    Physical Exam  Constitutional: She is oriented to person, place, and time. She appears well-nourished. No distress.  Eyes: Pupils are equal, round, and reactive to light. EOM are normal.  Cardiovascular: Normal rate.  Pulmonary/Chest: Effort normal.  Abdominal: She exhibits no distension.  Neurological: She is alert and oriented to person, place, and time. No cranial nerve deficit. Gait normal.  Skin: Skin is dry. She is not diaphoretic.  Psychiatric: Her behavior is normal. Judgment and thought content normal. Her mood appears anxious. Her affect is not angry, not blunt, not labile and not inappropriate. Her speech is not rapid and/or pressured, not delayed, not tangential and not slurred. Cognition and memory are normal. She exhibits a depressed mood. She is communicative.  Vitals reviewed.   No results found for this or any previous visit (from the past 72 hour(s)).  No results found.  ASSESSMENT AND PLAN:  Julia Wilcox was seen today for follow-up.  Diagnoses and all orders for this visit:  PTSD (post-traumatic stress disorder): She will start lexapro 2.5 mg for one week then increase to 5 mg.  FU in about two weeks.  Advised she take klonopin as needed for acute anxeity and panic. No SI/HI at this time.  FMLA needs to be completed as follows. Up to 8 hours weekly as needed at patient's discretion.    Grief reaction    The patient is advised to call or return to clinic if she does not  see an improvement in symptoms, or to seek the care of the closest emergency department if she worsens with the above plan.   Philis Fendt, MHS, PA-C [facility] 02/11/2018 10:38 AM

## 2018-02-11 NOTE — Telephone Encounter (Signed)
Patient is already on FMLA under [employer] cannot have two claims. Patient will need to call [employer] and discuss this with them.

## 2018-02-11 NOTE — Telephone Encounter (Signed)
Please relay to patient.  I have already released her to go back to work without restrictions so the old claim should be complete, however I defer to you experience.

## 2018-02-13 ENCOUNTER — Telehealth: Payer: Self-pay | Admitting: Physician Assistant

## 2018-02-13 NOTE — Telephone Encounter (Signed)
Patient needs FMLA and disability forms completed for a new claim she enter with [employer]. I have completed the forms they just need to be signed there are 4 pages that have to be signed I have placed them all on top and highlighted them. Please return the signed forms to my box at the 102 checkout desk.  Thank you

## 2018-02-17 NOTE — Telephone Encounter (Signed)
Paperwork scanned and faxed on 02/17/18

## 2018-02-21 ENCOUNTER — Ambulatory Visit: Payer: Managed Care, Other (non HMO) | Admitting: Physician Assistant

## 2018-02-25 DIAGNOSIS — Z0271 Encounter for disability determination: Secondary | ICD-10-CM

## 2018-02-28 ENCOUNTER — Encounter: Payer: Self-pay | Admitting: Physician Assistant

## 2018-02-28 ENCOUNTER — Ambulatory Visit: Payer: Managed Care, Other (non HMO) | Admitting: Physician Assistant

## 2018-02-28 VITALS — BP 101/67 | HR 86 | Temp 98.2°F | Resp 16 | Ht 63.0 in | Wt 158.0 lb

## 2018-02-28 DIAGNOSIS — F4310 Post-traumatic stress disorder, unspecified: Secondary | ICD-10-CM

## 2018-02-28 DIAGNOSIS — Z72820 Sleep deprivation: Secondary | ICD-10-CM | POA: Diagnosis not present

## 2018-02-28 DIAGNOSIS — F322 Major depressive disorder, single episode, severe without psychotic features: Secondary | ICD-10-CM | POA: Diagnosis not present

## 2018-02-28 DIAGNOSIS — F4321 Adjustment disorder with depressed mood: Secondary | ICD-10-CM | POA: Diagnosis not present

## 2018-02-28 MED ORDER — TRAZODONE HCL 50 MG PO TABS: 25.0000 mg | ORAL_TABLET | Freq: Every evening | ORAL | 3 refills | Status: AC | PRN

## 2018-02-28 NOTE — Progress Notes (Signed)
02/28/2018 11:16 AM   DOB: 01/07/1971 / MRN: [id number]  SUBJECTIVE:  Julia Wilcox is a 47 y.o. female presenting for recheck of PTSD.  Since starting the SSRI patient feels that her symptoms have worsened.  She is back to 40 hours a week and does seem that most of the stress that she complains about today surrounds her work.  She tells me that people will ask her questions about the death of her husband and unprofessional manner such as "why you so sad because she only knew him for 4 years."  People also ask her "should you be better by now."  Symptoms present for since February.  The problem is waxing and waning but worsening recently. She has tried Lexapro for the last 3 weeks with no significant change in symptoms.  She is sleeping poorly and sometimes not at all.  She does have FMLA set for this problem.  She continues to go to counseling 1 time a week.  She feels that she wants to go home to see her son in [place] along with her family.  She had recently moved with her deceased husband in 06-09-2023 at which point she found her current work and bought a home.  She is not sure if she is to go back to [place] or not eventually.  She does like the weather here.  She is allergic to bee venom and dilaudid [hydromorphone hcl].   She  has no past medical history on file.    She  reports that she has been smoking.  She has a 3.00 pack-year smoking history. She has never used smokeless tobacco. She reports that she does not drink alcohol or use drugs. She  has no sexual activity history on file. The patient  has a past surgical history that includes Back surgery; Appendectomy; Cholecystectomy; Tonsillectomy; Tubal ligation; and Spine surgery.  Her family history includes Diabetes in her maternal grandmother, mother, and sister; Hypertension in her mother and sister; Stroke in her maternal grandmother and mother.  Review of Systems  Psychiatric/Behavioral: Positive for depression and memory loss.  Negative for hallucinations, substance abuse and suicidal ideas. The patient is nervous/anxious and has insomnia.     The problem list and medications were reviewed and updated by myself where necessary and exist elsewhere in the encounter.   OBJECTIVE:  BP 101/67   Pulse 86   Temp 98.2 F (36.8 C) (Oral)   Resp 16   Ht 5' 3"  (1.6 m)   Wt 158 lb (71.7 kg)   SpO2 98%   BMI 27.99 kg/m   Wt Readings from Last 3 Encounters:  02/28/18 158 lb (71.7 kg)  02/10/18 162 lb 3.2 oz (73.6 kg)  01/24/18 158 lb (71.7 kg)   Temp Readings from Last 3 Encounters:  02/28/18 98.2 F (36.8 C) (Oral)  02/10/18 98.1 F (36.7 C) (Oral)  01/24/18 98 F (36.7 C) (Oral)   BP Readings from Last 3 Encounters:  02/28/18 101/67  02/10/18 115/76  01/24/18 101/68   Pulse Readings from Last 3 Encounters:  02/28/18 86  02/10/18 99  01/24/18 84    Physical Exam  Constitutional: She is oriented to person, place, and time. She appears well-nourished. No distress.  Eyes: Pupils are equal, round, and reactive to light. EOM are normal.  Cardiovascular: Normal rate.  Pulmonary/Chest: Effort normal.  Abdominal: She exhibits no distension.  Neurological: She is alert and oriented to person, place, and time. No cranial nerve deficit. Gait normal.  Skin: Skin is dry. She is not diaphoretic.  Psychiatric: She has a normal mood and affect. Her behavior is normal. Judgment and thought content normal.  She is tearful at times during the interview.  Vitals reviewed.   No results found for: HGBA1C  No results found for: WBC, HGB, HCT, MCV, PLT  No results found for: CREATININE, BUN, NA, K, CL, CO2  No results found for: ALT, AST, GGT, ALKPHOS, BILITOT  No results found for: TSH  No results found for: CHOL, HDL, LDLCALC, LDLDIRECT, TRIG, CHOLHDL   ASSESSMENT AND PLAN:  Dannielle was seen today for depression and anxiety.  Diagnoses and all orders for this visit:  PTSD (post-traumatic stress  disorder): We need to take some of her daily stressors away.  I am writing her out of work for a week and we will see her back a week from this coming Monday.  I have encouraged her to reconnect with people that she cares about.  Grief reaction: Benzodiazepine as needed if emotions become overwhelming.  Current severe episode of major depressive disorder without psychotic features without prior episode [facility]): Continue Lexapro too early to tell if the current dose is too high.  Poor sleep: Advised that she needs to be sleeping. -     traZODone (DESYREL) 50 MG tablet; Take 0.5-1.5 tablets (25-75 mg total) by mouth at bedtime as needed for sleep.    The patient is advised to call or return to clinic if she does not see an improvement in symptoms, or to seek the care of the closest emergency department if she worsens with the above plan.   Philis Fendt, MHS, PA-C [facility] 02/28/2018 11:16 AM

## 2018-02-28 NOTE — Patient Instructions (Addendum)
It was good talking to you today.  I will see you again next Tuesday.     Continue the lexapro at its current dose.  Use Ativan as needed.  Focus on getting a good nights rest.      IF you received an x-ray today, you will receive an invoice from [facility]. Please contact [facility] at [phone] with questions or concerns regarding your invoice.   IF you received labwork today, you will receive an invoice from [facility]. Please contact [facility] at [phone] with questions or concerns regarding your invoice.   Our billing staff will not be able to assist you with questions regarding bills from these companies.  You will be contacted with the lab results as soon as they are available. The fastest way to get your results is to activate your My Chart account. Instructions are located on the last page of this paperwork. If you have not heard from [place] regarding the results in 2 weeks, please contact this office.

## 2018-03-10 ENCOUNTER — Other Ambulatory Visit: Payer: Self-pay

## 2018-03-10 ENCOUNTER — Encounter: Payer: Self-pay | Admitting: Physician Assistant

## 2018-03-10 ENCOUNTER — Ambulatory Visit: Payer: Managed Care, Other (non HMO) | Admitting: Physician Assistant

## 2018-03-10 VITALS — BP 107/71 | HR 81 | Temp 99.0°F | Resp 18 | Ht 63.0 in | Wt 157.4 lb

## 2018-03-10 DIAGNOSIS — F4321 Adjustment disorder with depressed mood: Secondary | ICD-10-CM

## 2018-03-10 DIAGNOSIS — F322 Major depressive disorder, single episode, severe without psychotic features: Secondary | ICD-10-CM

## 2018-03-10 DIAGNOSIS — F4310 Post-traumatic stress disorder, unspecified: Secondary | ICD-10-CM

## 2018-03-10 NOTE — Progress Notes (Signed)
03/10/2018 10:56 AM   DOB: [birth year] / MRN: [id number]  SUBJECTIVE:  Julia Wilcox is a 47 y.o. female presenting for recheck PTSD 2/2  Grief reaction.  She came back from [place] to see family and had a nice trip. Feels that she needs another week off before going back to work.  She blames herself for her husbands passing.  As it turns out he was having some symptoms indicative of CAD and she was able to get him to seek medical attention however was treated for MSK etiology. She feels that she should have put him in the car and taken him to the emergency room, however she states she would have had to "hogtie him" to do this. She constantly replays this dialogue. She palns to go to grief counseling this Wednesday.  She is willing to check in with her son on a daily basis this week.  She denies suicidality and homicidality.  She is taking Lexapro and is not missing doses. She gets about 6 hours of sleep with trazodone but did not take this last night.    Current Outpatient Medications:  .  escitalopram (LEXAPRO) 5 MG tablet, Take 5 mg by mouth daily., Disp: , Rfl:  .  LORazepam (ATIVAN) 0.5 MG tablet, Take 0.5 mg by mouth every 8 (eight) hours., Disp: , Rfl:  .  traZODone (DESYREL) 50 MG tablet, Take 0.5-1.5 tablets (25-75 mg total) by mouth at bedtime as needed for sleep., Disp: 45 tablet, Rfl: 3   She is allergic to bee venom and dilaudid [hydromorphone hcl].   She  has no past medical history on file.    She  reports that she has been smoking.  She has a 3.00 pack-year smoking history. She has never used smokeless tobacco. She reports that she does not drink alcohol or use drugs. She  has no sexual activity history on file. The patient  has a past surgical history that includes Back surgery; Appendectomy; Cholecystectomy; Tonsillectomy; Tubal ligation; and Spine surgery.  Her family history includes Diabetes in her maternal grandmother, mother, and sister; Hypertension in her mother and  sister; Stroke in her maternal grandmother and mother.  Review of Systems  Psychiatric/Behavioral: Positive for depression. Negative for hallucinations, memory loss, substance abuse and suicidal ideas. The patient is nervous/anxious and has insomnia.     The problem list and medications were reviewed and updated by myself where necessary and exist elsewhere in the encounter.   OBJECTIVE:  BP 107/71   Pulse 81   Temp 99 F (37.2 C) (Oral)   Resp 18   Ht 5' 3"  (1.6 m)   Wt 157 lb 6.4 oz (71.4 kg)   LMP 02/21/2018   SpO2 98%   BMI 27.88 kg/m   Wt Readings from Last 3 Encounters:  03/10/18 157 lb 6.4 oz (71.4 kg)  02/28/18 158 lb (71.7 kg)  02/10/18 162 lb 3.2 oz (73.6 kg)   Temp Readings from Last 3 Encounters:  03/10/18 99 F (37.2 C) (Oral)  02/28/18 98.2 F (36.8 C) (Oral)  02/10/18 98.1 F (36.7 C) (Oral)   BP Readings from Last 3 Encounters:  03/10/18 107/71  02/28/18 101/67  02/10/18 115/76   Pulse Readings from Last 3 Encounters:  03/10/18 81  02/28/18 86  02/10/18 99    Physical Exam  Constitutional: She is oriented to person, place, and time. She appears well-nourished. No distress.  Eyes: Pupils are equal, round, and reactive to light. EOM are normal.  Cardiovascular:  Normal rate.  Pulmonary/Chest: Effort normal.  Abdominal: She exhibits no distension.  Neurological: She is alert and oriented to person, place, and time. No cranial nerve deficit. Gait normal.  Skin: Skin is dry. She is not diaphoretic.  Psychiatric: Her speech is normal and behavior is normal. Her mood appears not anxious. Her affect is angry. Her affect is not blunt, not labile and not inappropriate. She is not agitated, not aggressive, not slowed, not withdrawn and not actively hallucinating. Thought content is not paranoid and not delusional. Cognition and memory are not impaired. She does not express impulsivity or inappropriate judgment. She exhibits a depressed mood. She expresses no  homicidal and no suicidal ideation. She expresses no suicidal plans and no homicidal plans. She exhibits normal recent memory and normal remote memory. She is attentive.  Vitals reviewed.   No results found for: HGBA1C  No results found for: WBC, HGB, HCT, MCV, PLT  No results found for: CREATININE, BUN, NA, K, CL, CO2  No results found for: ALT, AST, GGT, ALKPHOS, BILITOT  No results found for: TSH  No results found for: CHOL, HDL, LDLCALC, LDLDIRECT, TRIG, CHOLHDL   ASSESSMENT AND PLAN:  [name] was seen today for depression.  Diagnoses and all orders for this visit:  PTSD (post-traumatic stress disorder): Continue plan.  Grief counseling on Wednesday.  She seems angry at herself today for not doing more.   Grief reaction  Current severe episode of major depressive disorder without psychotic features without prior episode [facility])    The patient is advised to call or return to clinic if she does not see an improvement in symptoms, or to seek the care of the closest emergency department if she worsens with the above plan.   Philis Fendt, MHS, PA-C [facility] 03/10/2018 10:56 AM

## 2018-03-10 NOTE — Patient Instructions (Signed)
     IF you received an x-ray today, you will receive an invoice from [facility]. Please contact  Radiology at [phone] with questions or concerns regarding your invoice.   IF you received labwork today, you will receive an invoice from [facility]. Please contact [facility] at [phone] with questions or concerns regarding your invoice.   Our billing staff will not be able to assist you with questions regarding bills from these companies.  You will be contacted with the lab results as soon as they are available. The fastest way to get your results is to activate your My Chart account. Instructions are located on the last page of this paperwork. If you have not heard from us regarding the results in 2 weeks, please contact this office.     

## 2018-03-14 ENCOUNTER — Other Ambulatory Visit: Payer: Self-pay

## 2018-03-14 ENCOUNTER — Ambulatory Visit: Payer: Managed Care, Other (non HMO) | Admitting: Physician Assistant

## 2018-03-14 ENCOUNTER — Encounter: Payer: Self-pay | Admitting: Physician Assistant

## 2018-03-14 VITALS — BP 112/70 | HR 77 | Temp 98.6°F | Resp 16 | Ht 63.5 in | Wt 161.0 lb

## 2018-03-14 DIAGNOSIS — F322 Major depressive disorder, single episode, severe without psychotic features: Secondary | ICD-10-CM

## 2018-03-14 DIAGNOSIS — F4321 Adjustment disorder with depressed mood: Secondary | ICD-10-CM

## 2018-03-14 DIAGNOSIS — F4310 Post-traumatic stress disorder, unspecified: Secondary | ICD-10-CM | POA: Diagnosis not present

## 2018-03-14 MED ORDER — ESCITALOPRAM OXALATE 10 MG PO TABS: 10.0000 mg | ORAL_TABLET | Freq: Every day | ORAL | 3 refills | Status: DC

## 2018-03-14 NOTE — Patient Instructions (Signed)
     IF you received an x-ray today, you will receive an invoice from [facility]. Please contact [facility] at [phone] with questions or concerns regarding your invoice.   IF you received labwork today, you will receive an invoice from [facility]. Please contact [facility] at [phone] with questions or concerns regarding your invoice.   Our billing staff will not be able to assist you with questions regarding bills from these companies.  You will be contacted with the lab results as soon as they are available. The fastest way to get your results is to activate your My Chart account. Instructions are located on the last page of this paperwork. If you have not heard from us regarding the results in 2 weeks, please contact this office.     

## 2018-03-14 NOTE — Progress Notes (Signed)
03/14/2018 4:16 PM   DOB: 1971-08-06 / MRN: [id number]  SUBJECTIVE:  Julia Wilcox is a 47 y.o. female presenting for PTSD, grief, depression check.  I have been seeing her for a few months now she has had several "ups and downs."  Today she thinks that she is having a good day and tells me that she now has a very old friends staying in the house with her and this seems to be helping her immensely.  She does not feel so alone and also feels safer at home.  She is starting to sleep well.  Her mood is a little more stable and she is no longer having extreme "lows."  She would like to entertain a higher dose of Lexapro she thinks is been somewhat helpful.  She does say that she is starting to sleep now.  She would like to try going back to work on a reduced schedule.  She is allergic to bee venom and dilaudid [hydromorphone hcl].   She  has no past medical history on file.    She  reports that she has been smoking.  She has a 3.00 pack-year smoking history. She has never used smokeless tobacco. She reports that she does not drink alcohol or use drugs. She  has no sexual activity history on file. The patient  has a past surgical history that includes Back surgery; Appendectomy; Cholecystectomy; Tonsillectomy; Tubal ligation; and Spine surgery.  Her family history includes Diabetes in her maternal grandmother, mother, and sister; Hypertension in her mother and sister; Stroke in her maternal grandmother and mother.  Review of Systems  Psychiatric/Behavioral: Positive for depression and memory loss. Negative for hallucinations, substance abuse and suicidal ideas. The patient is not nervous/anxious and does not have insomnia.     The problem list and medications were reviewed and updated by myself where necessary and exist elsewhere in the encounter.   OBJECTIVE:  BP 112/70 (BP Location: Right Arm, Patient Position: Sitting, Cuff Size: Normal)   Pulse 77   Temp 98.6 F (37 C) (Oral)   Resp  16   Ht 5' 3.5" (1.613 m)   Wt 161 lb (73 kg)   LMP 02/21/2018   SpO2 99%   BMI 28.07 kg/m   Wt Readings from Last 3 Encounters:  03/14/18 161 lb (73 kg)  03/10/18 157 lb 6.4 oz (71.4 kg)  02/28/18 158 lb (71.7 kg)   Temp Readings from Last 3 Encounters:  03/14/18 98.6 F (37 C) (Oral)  03/10/18 99 F (37.2 C) (Oral)  02/28/18 98.2 F (36.8 C) (Oral)   BP Readings from Last 3 Encounters:  03/14/18 112/70  03/10/18 107/71  02/28/18 101/67   Pulse Readings from Last 3 Encounters:  03/14/18 77  03/10/18 81  02/28/18 86    Physical Exam  Constitutional: She is oriented to person, place, and time. She appears well-nourished. No distress.  Eyes: Pupils are equal, round, and reactive to light. EOM are normal.  Cardiovascular: Normal rate.  Pulmonary/Chest: Effort normal.  Abdominal: She exhibits no distension.  Neurological: She is alert and oriented to person, place, and time. No cranial nerve deficit. Gait normal.  Skin: Skin is dry. She is not diaphoretic.  Psychiatric: She has a normal mood and affect.  Vitals reviewed.   No results found for: HGBA1C  No results found for: WBC, HGB, HCT, MCV, PLT  No results found for: CREATININE, BUN, NA, K, CL, CO2  No results found for: ALT, AST, GGT, ALKPHOS,  BILITOT  No results found for: TSH  No results found for: CHOL, HDL, LDLCALC, LDLDIRECT, TRIG, CHOLHDL   ASSESSMENT AND PLAN:  Julia Wilcox was seen today for follow-up and medication refill.  Diagnoses and all orders for this visit:  PTSD (post-traumatic stress disorder): She seems to be feeling better today however I have seen her do well and then become very dysthymic.  She seems less angry today and seems more accepting of what happened to her husband and thus her life.  She is sleeping better.  She is been on the Lexapro now for about a week so I suspect she is starting to see some improvement in mood secondary to medication.  Increasing her Lexapro from 5 to 10  mg.  I will see her back in about 10 days to see how she is doing.  Sending her back to work for 4 hours a day for the next 10 days. -     escitalopram (LEXAPRO) 10 MG tablet; Take 1 tablet (10 mg total) by mouth daily.  Grief reaction  Current severe episode of major depressive disorder without psychotic features without prior episode (HCC) -     escitalopram (LEXAPRO) 10 MG tablet; Take 1 tablet (10 mg total) by mouth daily.    The patient is advised to call or return to clinic if she does not see an improvement in symptoms, or to seek the care of the closest emergency department if she worsens with the above plan.   Philis Fendt, MHS, PA-C [facility] 03/14/2018 4:16 PM

## 2018-03-26 ENCOUNTER — Ambulatory Visit: Payer: Managed Care, Other (non HMO) | Admitting: Physician Assistant

## 2018-03-26 ENCOUNTER — Other Ambulatory Visit: Payer: Self-pay

## 2018-03-26 ENCOUNTER — Encounter: Payer: Self-pay | Admitting: Physician Assistant

## 2018-03-26 VITALS — BP 101/63 | HR 94 | Temp 98.0°F | Resp 16 | Ht 63.5 in | Wt 162.6 lb

## 2018-03-26 DIAGNOSIS — F4310 Post-traumatic stress disorder, unspecified: Secondary | ICD-10-CM | POA: Diagnosis not present

## 2018-03-26 DIAGNOSIS — F4321 Adjustment disorder with depressed mood: Secondary | ICD-10-CM | POA: Diagnosis not present

## 2018-03-26 DIAGNOSIS — F322 Major depressive disorder, single episode, severe without psychotic features: Secondary | ICD-10-CM | POA: Diagnosis not present

## 2018-03-26 NOTE — Progress Notes (Signed)
03/26/2018 9:54 AM   DOB: [birth year] / MRN: [id number]  SUBJECTIVE:  Julia Wilcox is a 47 y.o. female presenting for recheck PTSD, grief and depression. Symptoms present for roughly 5-6 months after the sudden death of her young husband.  The problem is improving. She has tried Lexapro 5 and now 10 mg for the last 10 days. She feels somewhat "groggy" but is no longer having intense episodes of sadness. She is sleeping better now.  She takes trazodone occasionally to help her rest.  She will take ativan if she is felling "keyed up."    She is allergic to bee venom and dilaudid [hydromorphone hcl].   She  has no past medical history on file.    She  reports that she has been smoking.  She has a 3.00 pack-year smoking history. She has never used smokeless tobacco. She reports that she does not drink alcohol or use drugs. She  has no sexual activity history on file. The patient  has a past surgical history that includes Back surgery; Appendectomy; Cholecystectomy; Tonsillectomy; Tubal ligation; and Spine surgery.  Her family history includes Diabetes in her maternal grandmother, mother, and sister; Hypertension in her mother and sister; Stroke in her maternal grandmother and mother.  Review of Systems  Constitutional: Negative for chills, diaphoresis and fever.  Eyes: Negative.   Respiratory: Negative for cough, hemoptysis, sputum production, shortness of breath and wheezing.   Cardiovascular: Negative for chest pain, orthopnea and leg swelling.  Gastrointestinal: Negative for abdominal pain, blood in stool, constipation, diarrhea, heartburn, melena, nausea and vomiting.  Genitourinary: Negative for dysuria, flank pain, frequency, hematuria and urgency.  Skin: Negative for rash.  Neurological: Negative for dizziness, sensory change, speech change, focal weakness and headaches.  Psychiatric/Behavioral: Positive for depression and memory loss. Negative for hallucinations, substance abuse and  suicidal ideas. The patient is not nervous/anxious and does not have insomnia.     The problem list and medications were reviewed and updated by myself where necessary and exist elsewhere in the encounter.   OBJECTIVE:  BP 101/63   Pulse 94   Temp 98 F (36.7 C)   Resp 16   Ht 5' 3.5" (1.613 m)   Wt 162 lb 9.6 oz (73.8 kg)   SpO2 96%   BMI 28.35 kg/m   Wt Readings from Last 3 Encounters:  03/26/18 162 lb 9.6 oz (73.8 kg)  03/14/18 161 lb (73 kg)  03/10/18 157 lb 6.4 oz (71.4 kg)   Temp Readings from Last 3 Encounters:  03/26/18 98 F (36.7 C)  03/14/18 98.6 F (37 C) (Oral)  03/10/18 99 F (37.2 C) (Oral)   BP Readings from Last 3 Encounters:  03/26/18 101/63  03/14/18 112/70  03/10/18 107/71   Pulse Readings from Last 3 Encounters:  03/26/18 94  03/14/18 77  03/10/18 81    Physical Exam  Constitutional: She is oriented to person, place, and time. She appears well-nourished. No distress.  Eyes: Pupils are equal, round, and reactive to light. EOM are normal.  Cardiovascular: Normal rate.  Pulmonary/Chest: Effort normal.  Abdominal: She exhibits no distension.  Neurological: She is alert and oriented to person, place, and time. No cranial nerve deficit. Gait normal.  Skin: Skin is dry. She is not diaphoretic.  Psychiatric: She has a normal mood and affect. Her behavior is normal. Judgment and thought content normal.  Vitals reviewed.   No results found for: HGBA1C  No results found for: WBC, HGB, HCT, MCV, PLT  No results found for: CREATININE, BUN, NA, K, CL, CO2  No results found for: ALT, AST, GGT, ALKPHOS, BILITOT  No results found for: TSH  No results found for: CHOL, HDL, LDLCALC, LDLDIRECT, TRIG, CHOLHDL   ASSESSMENT AND PLAN:  Karsten was seen today for post-traumatic stress disorder.  Diagnoses and all orders for this visit:  PTSD (post-traumatic stress disorder): Improving for two visits now.  Will see her back in ten days.  She should  work no more than 4 hours a day as work has caused her symptoms to spiral in the past. Will probably be able to get her back up to 6 hours per shift after two weeks. No changes in medication at this time.   Grief reaction  Current severe episode of major depressive disorder without psychotic features without prior episode [facility])    The patient is advised to call or return to clinic if she does not see an improvement in symptoms, or to seek the care of the closest emergency department if she worsens with the above plan.   Philis Fendt, MHS, PA-C [facility] 03/26/2018 9:54 AM

## 2018-03-26 NOTE — Patient Instructions (Signed)
     IF you received an x-ray today, you will receive an invoice from [facility]. Please contact [facility] at [phone] with questions or concerns regarding your invoice.   IF you received labwork today, you will receive an invoice from [facility]. Please contact [facility] at [phone] with questions or concerns regarding your invoice.   Our billing staff will not be able to assist you with questions regarding bills from these companies.  You will be contacted with the lab results as soon as they are available. The fastest way to get your results is to activate your My Chart account. Instructions are located on the last page of this paperwork. If you have not heard from us regarding the results in 2 weeks, please contact this office.     

## 2018-04-04 ENCOUNTER — Telehealth: Payer: Self-pay | Admitting: Physician Assistant

## 2018-04-04 NOTE — Telephone Encounter (Signed)
This is concerning a letter from [place] on 03/14/18. Pt's employer is wanting a more detailed letter stating-What happens to the pt  if she gets stung, The process her job needs to put in place if she gets stung, What type of protective gear her job could make available for her and finally-If pt's restrictions are temporary or permanent? Please advise pt when ready. CB# [phone]

## 2018-04-09 NOTE — Telephone Encounter (Signed)
I have completed.  We can fax or she can pick up. Please let her know. Philis Fendt, MS, PA-C 1:37 PM, 04/09/2018

## 2018-04-09 NOTE — Telephone Encounter (Signed)
Patient is calling and checking the status of getting this note. Please advise.

## 2018-04-10 ENCOUNTER — Telehealth: Payer: Self-pay | Admitting: Physician Assistant

## 2018-04-10 NOTE — Telephone Encounter (Signed)
Copied from [place] [phone]. Topic: General - Other >> Apr 10, 2018 12:04 PM Yvette Rack wrote: Reason for CRM: Pt states she would like the letter faxed to [phone]

## 2018-04-10 NOTE — Telephone Encounter (Signed)
Detailed message left to give [place] a fax number or she can pick up at [address].

## 2018-04-11 ENCOUNTER — Other Ambulatory Visit: Payer: Self-pay

## 2018-04-11 ENCOUNTER — Encounter: Payer: Self-pay | Admitting: Physician Assistant

## 2018-04-11 ENCOUNTER — Ambulatory Visit: Payer: Managed Care, Other (non HMO) | Admitting: Physician Assistant

## 2018-04-11 VITALS — BP 114/73 | HR 96 | Temp 98.6°F | Resp 16 | Ht 63.5 in | Wt 166.8 lb

## 2018-04-11 DIAGNOSIS — F324 Major depressive disorder, single episode, in partial remission: Secondary | ICD-10-CM | POA: Diagnosis not present

## 2018-04-11 DIAGNOSIS — F4321 Adjustment disorder with depressed mood: Secondary | ICD-10-CM

## 2018-04-11 DIAGNOSIS — F4310 Post-traumatic stress disorder, unspecified: Secondary | ICD-10-CM | POA: Diagnosis not present

## 2018-04-11 NOTE — Patient Instructions (Signed)
     IF you received an x-ray today, you will receive an invoice from [facility]. Please contact [facility] at [phone] with questions or concerns regarding your invoice.   IF you received labwork today, you will receive an invoice from [facility]. Please contact [facility] at [phone] with questions or concerns regarding your invoice.   Our billing staff will not be able to assist you with questions regarding bills from these companies.  You will be contacted with the lab results as soon as they are available. The fastest way to get your results is to activate your My Chart account. Instructions are located on the last page of this paperwork. If you have not heard from us regarding the results in 2 weeks, please contact this office.     

## 2018-04-11 NOTE — Telephone Encounter (Signed)
Could not find document from [facility].  Printed letter written 04/09/2018. Faxed to [facility] pt gave [phone] Letter and fax confirmation in envelope and at front desk for when pt comes in for appt today. [place] has letter.

## 2018-04-11 NOTE — Progress Notes (Signed)
04/11/2018 2:05 PM   DOB: 1970/09/28 / MRN: [id number]  SUBJECTIVE:  Julia Wilcox is a 47 y.o. female presenting for recehck depression, anxiety.  Long well documented history in the chart. Symptoms present since Feb.  The problem is better with 10 of lexpro po daily. . She has tried ativan, klonopin, as well as trazodone.   She is allergic to bee venom and dilaudid [hydromorphone hcl].   She  has no past medical history on file.    She  reports that she has been smoking. She has a 3.00 pack-year smoking history. She has never used smokeless tobacco. She reports that she does not drink alcohol or use drugs. She  has no sexual activity history on file. The patient  has a past surgical history that includes Back surgery; Appendectomy; Cholecystectomy; Tonsillectomy; Tubal ligation; and Spine surgery.  Her family history includes Diabetes in her maternal grandmother, mother, and sister; Hypertension in her mother and sister; Stroke in her maternal grandmother and mother.  Review of Systems  Psychiatric/Behavioral: Negative for depression, hallucinations, memory loss, substance abuse and suicidal ideas. The patient is not nervous/anxious and does not have insomnia.     The problem list and medications were reviewed and updated by myself where necessary and exist elsewhere in the encounter.   OBJECTIVE:  BP 114/73   Pulse 96   Temp 98.6 F (37 C)   Resp 16   Ht 5' 3.5" (1.613 m)   Wt 166 lb 12.8 oz (75.7 kg)   SpO2 96%   BMI 29.08 kg/m   Wt Readings from Last 3 Encounters:  04/11/18 166 lb 12.8 oz (75.7 kg)  03/26/18 162 lb 9.6 oz (73.8 kg)  03/14/18 161 lb (73 kg)   Temp Readings from Last 3 Encounters:  04/11/18 98.6 F (37 C)  03/26/18 98 F (36.7 C)  03/14/18 98.6 F (37 C) (Oral)   BP Readings from Last 3 Encounters:  04/11/18 114/73  03/26/18 101/63  03/14/18 112/70   Pulse Readings from Last 3 Encounters:  04/11/18 96  03/26/18 94  03/14/18 77     Physical Exam  Constitutional: She is oriented to person, place, and time. She appears well-nourished. No distress.  Eyes: Pupils are equal, round, and reactive to light. EOM are normal.  Cardiovascular: Normal rate.  Pulmonary/Chest: Effort normal.  Abdominal: She exhibits no distension.  Neurological: She is alert and oriented to person, place, and time. No cranial nerve deficit. Gait normal.  Skin: Skin is dry. She is not diaphoretic.  Psychiatric: She has a normal mood and affect. Her behavior is normal. Judgment and thought content normal.  Vitals reviewed.   No results found for: HGBA1C  No results found for: WBC, HGB, HCT, MCV, PLT  No results found for: CREATININE, BUN, NA, K, CL, CO2  No results found for: ALT, AST, GGT, ALKPHOS, BILITOT  No results found for: TSH  No results found for: CHOL, HDL, LDLCALC, LDLDIRECT, TRIG, CHOLHDL   ASSESSMENT AND PLAN:  Undrea was seen today for depression and anxiety.  Diagnoses and all orders for this visit:  Major depressive disorder with single episode, in partial remission ([place]) Comments: I am so happy with her progress.  Moving her up to 6 hour shifts.  RTC in about 10 days for [facility] release [facility].   PTSD (post-traumatic stress disorder)  Grief reaction    The patient is advised to call or return to clinic if she does not see an improvement  in symptoms, or to seek the care of the closest emergency department if she worsens with the above plan.   Philis Fendt, MHS, PA-C [facility] 04/11/2018 2:05 PM

## 2018-04-23 ENCOUNTER — Ambulatory Visit: Payer: Managed Care, Other (non HMO) | Admitting: Physician Assistant

## 2018-04-23 ENCOUNTER — Encounter: Payer: Self-pay | Admitting: Physician Assistant

## 2018-04-23 ENCOUNTER — Other Ambulatory Visit: Payer: Self-pay

## 2018-04-23 DIAGNOSIS — F322 Major depressive disorder, single episode, severe without psychotic features: Secondary | ICD-10-CM | POA: Diagnosis not present

## 2018-04-23 DIAGNOSIS — F4310 Post-traumatic stress disorder, unspecified: Secondary | ICD-10-CM | POA: Diagnosis not present

## 2018-04-23 MED ORDER — ESCITALOPRAM OXALATE 10 MG PO TABS: 10.0000 mg | ORAL_TABLET | Freq: Every day | ORAL | 1 refills | Status: AC

## 2018-04-23 NOTE — Patient Instructions (Addendum)
[facility] Address: [address], [place], [place] [address] Phone: [phone]    If you have lab work done today you will be contacted with your lab results within the next 2 weeks.  If you have not heard from [place] then please contact us. The fastest way to get your results is to register for My Chart.   IF you received an x-ray today, you will receive an invoice from [facility]. Please contact [facility] at [phone] with questions or concerns regarding your invoice.   IF you received labwork today, you will receive an invoice from [facility]. Please contact [facility] at [phone] with questions or concerns regarding your invoice.   Our billing staff will not be able to assist you with questions regarding bills from these companies.  You will be contacted with the lab results as soon as they are available. The fastest way to get your results is to activate your My Chart account. Instructions are located on the last page of this paperwork. If you have not heard from [place] regarding the results in 2 weeks, please contact this office.

## 2018-04-25 NOTE — Progress Notes (Signed)
04/25/2018 8:38 AM   DOB: May 18, 1971 / MRN: [id number]  SUBJECTIVE:  Julia Wilcox is a 47 y.o. female presenting for recheck depression and PTSD. HPI well documented in previous visits.  She feels stable today.  Denies feeling of depression and anxiety.  Sleeping well.  No longer needing trazodone. Has required two or so klonopin.  Tells me the lexapro makes her feel fatigued. Concentration and memory improving. No SI/HI. She feels ready to return to work without restriction.   She is allergic to bee venom and dilaudid [hydromorphone hcl].   She  has no past medical history on file.    She  reports that she has been smoking. She has a 3.00 pack-year smoking history. She has never used smokeless tobacco. She reports that she does not drink alcohol or use drugs. She  has no sexual activity history on file. The patient  has a past surgical history that includes Back surgery; Appendectomy; Cholecystectomy; Tonsillectomy; Tubal ligation; and Spine surgery.  Her family history includes Diabetes in her maternal grandmother, mother, and sister; Hypertension in her mother and sister; Stroke in her maternal grandmother and mother.  Review of Systems  Constitutional: Negative for chills, diaphoresis and fever.  Eyes: Negative.   Respiratory: Negative for cough, hemoptysis, sputum production, shortness of breath and wheezing.   Cardiovascular: Negative for chest pain, orthopnea and leg swelling.  Gastrointestinal: Negative for abdominal pain, blood in stool, constipation, diarrhea, heartburn, melena, nausea and vomiting.  Genitourinary: Negative for dysuria, flank pain, frequency, hematuria and urgency.  Skin: Negative for rash.  Neurological: Negative for dizziness, sensory change, speech change, focal weakness and headaches.  Psychiatric/Behavioral: Negative for depression, hallucinations, memory loss, substance abuse and suicidal ideas. The patient is not nervous/anxious and does not have  insomnia.     The problem list and medications were reviewed and updated by myself where necessary and exist elsewhere in the encounter.   OBJECTIVE:  BP 114/68   Pulse 81   Temp 98.6 F (37 C) (Oral)   Resp 18   Ht 5' 3.5" (1.613 m)   Wt 166 lb 3.2 oz (75.4 kg)   LMP 04/18/2018   SpO2 95%   BMI 28.98 kg/m   Wt Readings from Last 3 Encounters:  04/23/18 166 lb 3.2 oz (75.4 kg)  04/11/18 166 lb 12.8 oz (75.7 kg)  03/26/18 162 lb 9.6 oz (73.8 kg)   Temp Readings from Last 3 Encounters:  04/23/18 98.6 F (37 C) (Oral)  04/11/18 98.6 F (37 C)  03/26/18 98 F (36.7 C)   BP Readings from Last 3 Encounters:  04/23/18 114/68  04/11/18 114/73  03/26/18 101/63   Pulse Readings from Last 3 Encounters:  04/23/18 81  04/11/18 96  03/26/18 94    Physical Exam  Constitutional: She is oriented to person, place, and time. She appears well-nourished. No distress.  Eyes: Pupils are equal, round, and reactive to light. EOM are normal.  Cardiovascular: Normal rate.  Pulmonary/Chest: Effort normal.  Abdominal: She exhibits no distension.  Neurological: She is alert and oriented to person, place, and time. No cranial nerve deficit. Gait normal.  Skin: Skin is dry. She is not diaphoretic.  Psychiatric: She has a normal mood and affect.  Vitals reviewed.    ASSESSMENT AND PLAN:  Julia Wilcox was seen today for depression.  Diagnoses and all orders for this visit:  PTSD (post-traumatic stress disorder): Patient complaining of fatigue assoicated with medication however no longer feeling depressed and anxiety spectrum  illness well controlled with SSRI at current dose.  Will plan for another three months then consider 5 mg dosage for likely a year.  -     escitalopram (LEXAPRO) 10 MG tablet; Take 1 tablet (10 mg total) by mouth daily.  Current severe episode of major depressive disorder without psychotic features without prior episode (HCC) -     escitalopram (LEXAPRO) 10 MG tablet;  Take 1 tablet (10 mg total) by mouth daily.    The patient is advised to call or return to clinic if she does not see an improvement in symptoms, or to seek the care of the closest emergency department if she worsens with the above plan.   Philis Fendt, MHS, PA-C [facility] 04/25/2018 8:38 AM

## 2018-05-26 ENCOUNTER — Encounter: Payer: Self-pay | Admitting: Emergency Medicine

## 2018-05-26 ENCOUNTER — Ambulatory Visit: Payer: Managed Care, Other (non HMO) | Admitting: Emergency Medicine

## 2018-05-26 ENCOUNTER — Other Ambulatory Visit: Payer: Self-pay

## 2018-05-26 VITALS — BP 107/71 | HR 82 | Temp 98.9°F | Resp 16 | Ht 62.75 in | Wt 164.2 lb

## 2018-05-26 DIAGNOSIS — F4310 Post-traumatic stress disorder, unspecified: Secondary | ICD-10-CM

## 2018-05-26 DIAGNOSIS — F4321 Adjustment disorder with depressed mood: Secondary | ICD-10-CM | POA: Diagnosis not present

## 2018-05-26 NOTE — Progress Notes (Signed)
Julia Wilcox 47 y.o.   Chief Complaint  Patient presents with  . Establish Care  . Insect Bite    per patient need letter because she is allergic to bee sting, sometimes she works outside    [place]: This is a 47 y.o. female with a history of PTSD following sudden death of her husband 6 months ago.  Has been under the care of PA Julia Wilcox.  Presently on Lexapro.  Has not seen a psychiatrist yet. 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 No other complaints or medical concerns today. First visit with me and wants to establish care.  Healthy woman with no chronic medical problems.  Available medical records reviewed by me.  HPI   Prior to Admission medications   Medication Sig Start Date End Date Taking? Authorizing Provider  escitalopram (LEXAPRO) 10 MG tablet Take 1 tablet (10 mg total) by mouth daily. 04/23/18  Yes Julia Coop, PA-C  LORazepam (ATIVAN) 0.5 MG tablet Take 0.5 mg by mouth every 8 (eight) hours.    [provider]  traZODone (DESYREL) 50 MG tablet Take 0.5-1.5 tablets (25-75 mg total) by mouth at bedtime as needed for sleep. Patient not taking: Reported on 05/26/2018 02/28/18   Julia Coop, PA-C    Allergies  Allergen Reactions  . Bee Venom Anaphylaxis  . Dilaudid [Hydromorphone Hcl]     Patient Active Problem List   Diagnosis Date Noted  . Allergy to bee sting 10/18/2017    No past medical history on file.  Past Surgical History:  Procedure Laterality Date  . APPENDECTOMY    . BACK SURGERY    . CHOLECYSTECTOMY    . SPINE SURGERY    . TONSILLECTOMY    . TUBAL LIGATION      Social History   Socioeconomic History  . Marital status: Widowed    Spouse name: Not on file  . Number of children: Not on file  . Years of education: Not on file  . Highest education level: Not on file  Occupational History  .  Not on file  Social Needs  . Financial resource strain: Not on file  . Food insecurity:    Worry: Not on file    Inability: Not on file  . Transportation needs:    Medical: Not on file    Non-medical: Not on file  Tobacco Use  . Smoking status: Current Every Day Smoker    Packs/day: 0.30    Years: 10.00    Pack years: 3.00  . Smokeless tobacco: Never Used  Substance and Sexual Activity  . Alcohol use: No  . Drug use: No  . Sexual activity: Not on file  Lifestyle  . Physical activity:    Days per week: Not on file    Minutes per session: Not on file  . Stress: Not on file  Relationships  . Social connections:    Talks on phone: Not on file    Gets together: Not on file    Attends religious service: Not on file    Active member of club or organization: Not on file    Attends meetings of clubs or organizations: Not on file    Relationship status: Not on file  . Intimate partner violence:    Fear of current or ex partner: Not on file    Emotionally abused: Not on file  Physically abused: Not on file    Forced sexual activity: Not on file  Other Topics Concern  . Not on file  Social History Narrative   Husband - died in night of massive MI - October 15, 2016   Works at [employer] History  Problem Relation Age of Onset  . Diabetes Mother   . Hypertension Mother   . Stroke Mother   . Diabetes Sister   . Hypertension Sister   . Diabetes Maternal Grandmother   . Stroke Maternal Grandmother      Review of Systems  Constitutional: Negative.  Negative for chills and fever.  HENT: Negative.   Eyes: Negative.  Negative for blurred vision and double vision.  Respiratory: Negative.  Negative for cough and shortness of breath.   Cardiovascular: Negative.  Negative for chest pain and palpitations.  Gastrointestinal: Negative.  Negative for abdominal pain, diarrhea, nausea and vomiting.  Genitourinary: Negative.   Musculoskeletal: Negative.  Negative for myalgias and neck  pain.  Skin: Negative.  Negative for rash.  Neurological: Negative.  Negative for dizziness and headaches.  Endo/Heme/Allergies: Negative.   All other systems reviewed and are negative.   Vitals:   05/26/18 1127  BP: 107/71  Pulse: 82  Resp: 16  Temp: 98.9 F (37.2 C)  SpO2: 98%    Physical Exam  Constitutional: She is oriented to person, place, and time. She appears well-developed and well-nourished.  HENT:  Head: Normocephalic and atraumatic.  Eyes: Pupils are equal, round, and reactive to light. EOM are normal.  Neck: Normal range of motion.  Cardiovascular: Normal rate.  Pulmonary/Chest: Effort normal.  Musculoskeletal: Normal range of motion.  Neurological: She is alert and oriented to person, place, and time. No sensory deficit. She exhibits normal muscle tone.  Skin: Skin is warm and dry. Capillary refill takes less than 2 seconds.  Psychiatric: She has a normal mood and affect. Her behavior is normal.  Vitals reviewed.  A total of 25 minutes was spent in the room with the patient, greater than 50% of which was in counseling/coordination of care regarding diagnosis, management, treatment, medication, and need for psychiatric follow-up. .   ASSESSMENT & PLAN: Julia Wilcox was seen today for establish care and insect bite.  Diagnoses and all orders for this visit:  PTSD (post-traumatic stress disorder) -     Ambulatory referral to Psychiatry  Grief reaction -     Ambulatory referral to Psychiatry    Patient Instructions       If you have lab work done today you will be contacted with your lab results within the next 2 weeks.  If you have not heard from [place] then please contact us. The fastest way to get your results is to register for My Chart.   IF you received an x-ray today, you will receive an invoice from [facility]. Please contact [facility] at [phone] with questions or concerns regarding your invoice.   IF you received labwork  today, you will receive an invoice from [place]. Please contact [facility] at [phone] with questions or concerns regarding your invoice.   Our billing staff will not be able to assist you with questions regarding bills from these companies.  You will be contacted with the lab results as soon as they are available. The fastest way to get your results is to activate your My Chart account. Instructions are located on the last page of this paperwork. If you have not heard from [place] regarding the results in  2 weeks, please contact this office.     Living With Post-Traumatic Stress Disorder If you have been diagnosed with post-traumatic stress disorder (PTSD), you may be relieved that you now know why you have felt or behaved a certain way. Still, you may feel overwhelmed about the treatment ahead. You may also wonder how to get the support you need and how to deal with the condition day-to-day. If you are living with PTSD, there are ways to help you recover from it and manage your symptoms. How to manage lifestyle changes Managing stress Stress is your body's reaction to life changes and events, both good and bad. Stress can make PTSD worse. Take the following steps to cope with stress:  Talk with your health care provider or a counselor if you would like to learn more about techniques to reduce your stress. He or she may suggest some stress reduction techniques such as: ? Muscle relaxation exercises. ? Regular exercise. ? Meditation, yoga, or other mind-body exercises. ? Breathing exercises. ? Listening to quiet music. ? Spending time outside.  Maintain a healthy lifestyle. Eat a healthy diet, exercise regularly, get plenty of sleep, and take time to relax.  Spend time with others. Talk with them about how you are feeling and what kind of support you need. Try to not isolate yourself, even though you may feel like doing that. Isolating yourself can delay your recovery.  Do activities and  hobbies that you enjoy.  Pace yourself when doing stressful things. Take breaks, and reward yourself when you finish. Make sure that you do not overload your schedule.  Medicines Your health care provider may suggest certain medicines if he or she feels that they will help to improve your condition. Antidepressants or antipsychotic medicines may be used to treat PTSD. Avoid using alcohol and other substances that may prevent your medicines from working properly (may interact). It is also important to:  Talk with your pharmacist or health care provider about all medicines that you take, their possible side effects, and which medicines are safe to take together.  Make it your goal to take part in all treatment decisions (shared decision-making). Ask about possible side effects of medicines that your health care provider recommends, and tell him or her how you feel about having those side effects. It is best if shared decision-making with your health care provider is part of your total treatment plan.  If your health care provider prescribes a medicine, you may not notice the full benefits of it for 4-8 weeks. Most people who are treated for PTSD need to take medicine for at least 6-12 months after they feel better. If you are taking medicines as part of your treatment, do not stop taking medicines before you ask your health care provider if it is safe to stop. You may need to have the medicine slowly decreased (tapered) over time to lower the risk of harmful side effects. Relationships Many people who have PTSD have difficulty trusting others. Make an effort to:  Take risks and develop trust with close friends and family members. Developing trust in others can help you feel safe and connect you with emotional support.  Be open and honest about your feelings.  Try to have fun and relax in safe spaces, such as with friends and family.  Think about going to couples counseling, family education  classes, or family therapy. Your loved ones may not always know how to be supportive. Therapy can be helpful for everyone.  How to recognize changes in your condition Be aware of your symptoms and how often you have them. The following symptoms mean that you need to seek help for your PTSD:  You feel suspicious and angry.  You have repeated flashbacks.  You avoid going out or being with others.  You have an increasing number of fights with close friends or family members, such as your spouse.  You have thoughts about hurting yourself or others.  You cannot get relief from feelings of depression or anxiety.  Where to find support Talking to others  Explain that PTSD is a mental health problem. It is something that a person can develop after experiencing or seeing a life-threatening event. Tell them that PTSD makes you feel stress like you did during the event.  Talk to your loved ones about the symptoms you have. Also tell them what things or situations can cause symptoms to start (are triggers for you).  Assure your loved ones that there are treatments to help PTSD. Discuss possibly seeking family therapy or couples therapy.  If you are worried or fearful about seeking treatment, ask for support. Finances Not all insurance plans cover mental health care, so it is important to check with your insurance carrier. If paying for co-pays or counseling services is a problem, search for a local or county mental health care center. Public mental health care services may be offered there at a low cost or no cost when you are not able to see a private health care provider. If you are a veteran, contact a local veterans organization or veterans hospital for more information. If you are taking medicine for PTSD, you may be able to get the genericform, which may be less expensive than brand-name medicine. Some makers of prescription medicines also offer help to patients who cannot afford the medicines  that they need. Community resources  Find a support group in your community. Often, groups are available for [employer] veterans, trauma victims, and family members or caregivers.  Look into volunteer opportunities. Taking part in these can help you feel more connected to your community.  Contact a local organization to find out if you are eligible for a service dog.  Keep daily contact with at least one trusted friend or family member. Follow these instructions at home: Lifestyle  Exercise regularly. Try to do 30 or more minutes of physical activity on most days of the week.  Try to get 7-9 hours of sleep each night. To help with sleep: ? Keep your bedroom cool and dark. ? Do not eat a heavy meal during the hour before you go to bed. ? Do not drink alcohol or caffeinated drinks before bed. ? Avoid screen time before bedtime. This means avoiding use of your TV, computer, tablet, and cell phone.  Avoid using alcohol or drugs.  Practice self-soothing skills and use them daily.  Try to have fun and seek humor in your life. General instructions  If your PTSD is affecting your marriage or family, seek help from a family therapist.  Take over-the-counter and prescription medicines only as told by your health care provider.  Make sure to let all of your health care providers know that you have PTSD. This is especially important if you are having surgery or need to be admitted to the hospital.  Keep all follow-up visits as told by your health care providers. This is important. Where to find more information: Go to this website to find more information about PTSD,  treatment for PTSD, and how to get support:  [facility] for PTSD: www.ptsd.[employer]ball[employer].cz  Contact a health care provider if:  Your symptoms get worse or they do not get better. Get help right away if:  You have thoughts about hurting yourself or others. If you ever feel like you may hurt yourself or others, or have thoughts  about taking your own life, get help right away. You can go to your nearest emergency department or call:  Your local emergency services (911 in the [place]  A suicide crisis helpline, such as the [place] at 480-398-6531. This is open 24-hours a day.  Summary  If you are living with PTSD, there are ways to help you recover from it and manage your symptoms.  Find supportive environments and people who understand PTSD. Spend time in those places, and maintain contact with those people.  Work with your health care team to create a management plan that includes counseling, stress management techniques, and healthy lifestyle habits. This information is not intended to replace advice given to you by your health care provider. Make sure you discuss any questions you have with your health care provider. Document Released: 12/13/2016 Document Revised: 12/13/2016 Document Reviewed: 12/13/2016 Elsevier Interactive Patient Education  2018 [employer].      Agustina Caroli, MD [facility]

## 2018-05-26 NOTE — Patient Instructions (Addendum)
If you have lab work done today you will be contacted with your lab results within the next 2 weeks.  If you have not heard from [place] then please contact us. The fastest way to get your results is to register for My Chart.   IF you received an x-ray today, you will receive an invoice from [facility]. Please contact [facility] at [phone] with questions or concerns regarding your invoice.   IF you received labwork today, you will receive an invoice from [place]. Please contact [facility] at [phone] with questions or concerns regarding your invoice.   Our billing staff will not be able to assist you with questions regarding bills from these companies.  You will be contacted with the lab results as soon as they are available. The fastest way to get your results is to activate your My Chart account. Instructions are located on the last page of this paperwork. If you have not heard from [place] regarding the results in 2 weeks, please contact this office.     Living With Post-Traumatic Stress Disorder If you have been diagnosed with post-traumatic stress disorder (PTSD), you may be relieved that you now know why you have felt or behaved a certain way. Still, you may feel overwhelmed about the treatment ahead. You may also wonder how to get the support you need and how to deal with the condition day-to-day. If you are living with PTSD, there are ways to help you recover from it and manage your symptoms. How to manage lifestyle changes Managing stress Stress is your body's reaction to life changes and events, both good and bad. Stress can make PTSD worse. Take the following steps to cope with stress:  Talk with your health care provider or a counselor if you would like to learn more about techniques to reduce your stress. He or she may suggest some stress reduction techniques such as: ? Muscle relaxation exercises. ? Regular exercise. ? Meditation, yoga, or other  mind-body exercises. ? Breathing exercises. ? Listening to quiet music. ? Spending time outside.  Maintain a healthy lifestyle. Eat a healthy diet, exercise regularly, get plenty of sleep, and take time to relax.  Spend time with others. Talk with them about how you are feeling and what kind of support you need. Try to not isolate yourself, even though you may feel like doing that. Isolating yourself can delay your recovery.  Do activities and hobbies that you enjoy.  Pace yourself when doing stressful things. Take breaks, and reward yourself when you finish. Make sure that you do not overload your schedule.  Medicines Your health care provider may suggest certain medicines if he or she feels that they will help to improve your condition. Antidepressants or antipsychotic medicines may be used to treat PTSD. Avoid using alcohol and other substances that may prevent your medicines from working properly (may interact). It is also important to:  Talk with your pharmacist or health care provider about all medicines that you take, their possible side effects, and which medicines are safe to take together.  Make it your goal to take part in all treatment decisions (shared decision-making). Ask about possible side effects of medicines that your health care provider recommends, and tell him or her how you feel about having those side effects. It is best if shared decision-making with your health care provider is part of your total treatment plan.  If your health care provider prescribes a medicine, you may not notice the  full benefits of it for 4-8 weeks. Most people who are treated for PTSD need to take medicine for at least 6-12 months after they feel better. If you are taking medicines as part of your treatment, do not stop taking medicines before you ask your health care provider if it is safe to stop. You may need to have the medicine slowly decreased (tapered) over time to lower the risk of harmful  side effects. Relationships Many people who have PTSD have difficulty trusting others. Make an effort to:  Take risks and develop trust with close friends and family members. Developing trust in others can help you feel safe and connect you with emotional support.  Be open and honest about your feelings.  Try to have fun and relax in safe spaces, such as with friends and family.  Think about going to couples counseling, family education classes, or family therapy. Your loved ones may not always know how to be supportive. Therapy can be helpful for everyone.  How to recognize changes in your condition Be aware of your symptoms and how often you have them. The following symptoms mean that you need to seek help for your PTSD:  You feel suspicious and angry.  You have repeated flashbacks.  You avoid going out or being with others.  You have an increasing number of fights with close friends or family members, such as your spouse.  You have thoughts about hurting yourself or others.  You cannot get relief from feelings of depression or anxiety.  Where to find support Talking to others  Explain that PTSD is a mental health problem. It is something that a person can develop after experiencing or seeing a life-threatening event. Tell them that PTSD makes you feel stress like you did during the event.  Talk to your loved ones about the symptoms you have. Also tell them what things or situations can cause symptoms to start (are triggers for you).  Assure your loved ones that there are treatments to help PTSD. Discuss possibly seeking family therapy or couples therapy.  If you are worried or fearful about seeking treatment, ask for support. Finances Not all insurance plans cover mental health care, so it is important to check with your insurance carrier. If paying for co-pays or counseling services is a problem, search for a local or county mental health care center. Public mental health  care services may be offered there at a low cost or no cost when you are not able to see a private health care provider. If you are a veteran, contact a local veterans organization or veterans hospital for more information. If you are taking medicine for PTSD, you may be able to get the genericform, which may be less expensive than brand-name medicine. Some makers of prescription medicines also offer help to patients who cannot afford the medicines that they need. Community resources  Find a support group in your community. Often, groups are available for [employer] veterans, trauma victims, and family members or caregivers.  Look into volunteer opportunities. Taking part in these can help you feel more connected to your community.  Contact a local organization to find out if you are eligible for a service dog.  Keep daily contact with at least one trusted friend or family member. Follow these instructions at home: Lifestyle  Exercise regularly. Try to do 30 or more minutes of physical activity on most days of the week.  Try to get 7-9 hours of sleep each night. To help  with sleep: ? Keep your bedroom cool and dark. ? Do not eat a heavy meal during the hour before you go to bed. ? Do not drink alcohol or caffeinated drinks before bed. ? Avoid screen time before bedtime. This means avoiding use of your TV, computer, tablet, and cell phone.  Avoid using alcohol or drugs.  Practice self-soothing skills and use them daily.  Try to have fun and seek humor in your life. General instructions  If your PTSD is affecting your marriage or family, seek help from a family therapist.  Take over-the-counter and prescription medicines only as told by your health care provider.  Make sure to let all of your health care providers know that you have PTSD. This is especially important if you are having surgery or need to be admitted to the hospital.  Keep all follow-up visits as told by your health care  providers. This is important. Where to find more information: Go to this website to find more information about PTSD, treatment for PTSD, and how to get support:  [facility] for PTSD: www.ptsd.[employer].cz  Contact a health care provider if:  Your symptoms get worse or they do not get better. Get help right away if:  You have thoughts about hurting yourself or others. If you ever feel like you may hurt yourself or others, or have thoughts about taking your own life, get help right away. You can go to your nearest emergency department or call:  Your local emergency services (911 in the [place]  A suicide crisis helpline, such as the [place] at (437)287-7487. This is open 24-hours a day.  Summary  If you are living with PTSD, there are ways to help you recover from it and manage your symptoms.  Find supportive environments and people who understand PTSD. Spend time in those places, and maintain contact with those people.  Work with your health care team to create a management plan that includes counseling, stress management techniques, and healthy lifestyle habits. This information is not intended to replace advice given to you by your health care provider. Make sure you discuss any questions you have with your health care provider. Document Released: 12/13/2016 Document Revised: 12/13/2016 Document Reviewed: 12/13/2016 Elsevier Interactive Patient Education  [employer].

## 2018-06-05 ENCOUNTER — Telehealth: Payer: Self-pay | Admitting: Emergency Medicine

## 2018-06-05 NOTE — Telephone Encounter (Signed)
Forms are ready.  LMOVM at pts phone number given Copy placed for scanning into chart.

## 2018-06-05 NOTE — Telephone Encounter (Signed)
Gave a letter to Dr. Mitchel Honour. Her work is needing a signature and a box checked that states that she can work outside. Would like a phone call when it is ready.

## 2021-03-14 ENCOUNTER — Emergency Department (HOSPITAL_BASED_OUTPATIENT_CLINIC_OR_DEPARTMENT_OTHER): Payer: 59

## 2021-03-14 ENCOUNTER — Other Ambulatory Visit: Payer: Self-pay

## 2021-03-14 ENCOUNTER — Emergency Department (HOSPITAL_BASED_OUTPATIENT_CLINIC_OR_DEPARTMENT_OTHER)
Admission: EM | Admit: 2021-03-14 | Discharge: 2021-03-15 | Disposition: A | Discharge status: Home / Self Care | Payer: 59 | Attending: Emergency Medicine | Admitting: Emergency Medicine

## 2021-03-14 ENCOUNTER — Encounter (HOSPITAL_BASED_OUTPATIENT_CLINIC_OR_DEPARTMENT_OTHER): Payer: Self-pay | Admitting: *Deleted

## 2021-03-14 DIAGNOSIS — Z28310 Unvaccinated for covid-19: Secondary | ICD-10-CM | POA: Diagnosis not present

## 2021-03-14 DIAGNOSIS — U071 COVID-19: Secondary | ICD-10-CM | POA: Insufficient documentation

## 2021-03-14 DIAGNOSIS — R000 Tachycardia, unspecified: Secondary | ICD-10-CM | POA: Diagnosis not present

## 2021-03-14 DIAGNOSIS — F17210 Nicotine dependence, cigarettes, uncomplicated: Secondary | ICD-10-CM | POA: Diagnosis not present

## 2021-03-14 DIAGNOSIS — R5383 Other fatigue: Secondary | ICD-10-CM | POA: Diagnosis present

## 2021-03-14 DIAGNOSIS — D649 Anemia, unspecified: Secondary | ICD-10-CM | POA: Diagnosis not present

## 2021-03-14 LAB — RESP PANEL BY RT-PCR (FLU A&B, COVID) ARPGX2
Influenza A by PCR: NEGATIVE
Influenza B by PCR: NEGATIVE
SARS Coronavirus 2 by RT PCR: POSITIVE — AB

## 2021-03-14 LAB — BASIC METABOLIC PANEL
Anion gap: 7 (ref 5–15)
BUN: 11 mg/dL (ref 6–20)
CO2: 22 mmol/L (ref 22–32)
Calcium: 8.9 mg/dL (ref 8.9–10.3)
Chloride: 105 mmol/L (ref 98–111)
Creatinine, Ser: 0.78 mg/dL (ref 0.44–1.00)
GFR, Estimated: >60 mL/min (ref >60)
Glucose, Bld: 114 mg/dL — ABNORMAL HIGH (ref 70–99)
Potassium: 4 mmol/L (ref 3.5–5.1)
Sodium: 134 mmol/L — ABNORMAL LOW (ref 135–145)

## 2021-03-14 LAB — CBC WITH DIFFERENTIAL/PLATELET
Abs Immature Granulocytes: 0.03 10*3/uL (ref 0.00–0.07)
Basophils Absolute: 0 10*3/uL (ref 0.0–0.1)
Basophils Relative: 0 %
Eosinophils Absolute: 0 10*3/uL (ref 0.0–0.5)
Eosinophils Relative: 0 %
HCT: 31.8 % — ABNORMAL LOW (ref 36.0–46.0)
Hemoglobin: 10.8 g/dL — ABNORMAL LOW (ref 12.0–15.0)
Immature Granulocytes: 0 %
Lymphocytes Relative: 10 %
Lymphs Abs: 0.7 10*3/uL (ref 0.7–4.0)
MCH: 32.4 pg (ref 26.0–34.0)
MCHC: 34 g/dL (ref 30.0–36.0)
MCV: 95.5 fL (ref 80.0–100.0)
Monocytes Absolute: 0.3 10*3/uL (ref 0.1–1.0)
Monocytes Relative: 5 %
Neutro Abs: 5.9 10*3/uL (ref 1.7–7.7)
Neutrophils Relative %: 85 %
Platelets: 274 10*3/uL (ref 150–400)
RBC: 3.33 MIL/uL — ABNORMAL LOW (ref 3.87–5.11)
RDW: 13 % (ref 11.5–15.5)
WBC: 7 10*3/uL (ref 4.0–10.5)
nRBC: 0 % (ref 0.0–0.2)

## 2021-03-14 MED ORDER — SODIUM CHLORIDE 0.9 % IV BOLUS: 1000.0000 mL | Freq: Once | INTRAVENOUS | Status: DC

## 2021-03-14 MED ORDER — ACETAMINOPHEN 325 MG PO TABS
650.0000 mg | ORAL_TABLET | Freq: Once | ORAL | Status: AC
  Administered 2021-03-14: 650 mg via ORAL

## 2021-03-14 MED ORDER — ACETAMINOPHEN 325 MG PO TABS
ORAL_TABLET | ORAL | Status: AC
  Filled 2021-03-14: qty 2

## 2021-03-14 NOTE — Discharge Instructions (Addendum)
It is positive for COVID-19.  Please quarantine for 5 days, after that you may return back to work.  While you are here, you did have low blood cell counts.  Not sure if you have a history of anemia, but he should follow-up with your primary care doctor about this in the next few weeks.  If things worsen or change please return back to the ED for further evaluation.Marland Kitchen

## 2021-03-14 NOTE — ED Provider Notes (Signed)
[facility] Provider Note   CSN: [id number] Arrival date & time: 03/14/21  2118     History Chief Complaint  Patient presents with   Fever    Julia Wilcox is a 50 y.o. female.  Patient presents with fatigue, fever, feeling unwell x1 day.  She was seen yesterday at the urgent care where she had anaphylaxis after bee sting.  She was monitored, released with steroids and Pepcid.  She has been taking those, but today at work she felt flushed.  She also felt fatigued and more exhausted than normal.  She was having fevers as well.  She has not tried any over-the-counter medicine for the fevers.  She has not had any chest pain or shortness of breath. She is not covid vaccianted.  History reviewed. No pertinent past medical history.  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     Family History  Problem Relation Age of Onset   Diabetes Mother    Hypertension Mother    Stroke Mother    Diabetes Sister    Hypertension Sister    Diabetes Maternal Grandmother    Stroke Maternal Grandmother     Social History   Tobacco Use   Smoking status: Every Day    Packs/day: 0.30    Years: 10.00    Pack years: 3.00    Types: Cigarettes   Smokeless tobacco: Never  Substance Use Topics   Alcohol use: No   Drug use: No    Home Medications Prior to Admission medications   Medication Sig Start Date End Date Taking? Authorizing Provider  escitalopram (LEXAPRO) 10 MG tablet Take 1 tablet (10 mg total) by mouth daily. 04/23/18   Tereasa Coop, PA-C  LORazepam (ATIVAN) 0.5 MG tablet Take 0.5 mg by mouth every 8 (eight) hours.    [provider]  traZODone (DESYREL) 50 MG tablet Take  0.5-1.5 tablets (25-75 mg total) by mouth at bedtime as needed for sleep. Patient not taking: Reported on 05/26/2018 02/28/18   Tereasa Coop, PA-C    Allergies    Bee venom and Dilaudid [hydromorphone hcl]  Review of Systems   Review of Systems  Constitutional:  Positive for fatigue and fever.  HENT:  Negative for sore throat.   Respiratory:  Negative for chest tightness and shortness of breath.   Musculoskeletal:  Positive for myalgias.   Physical Exam Updated Vital Signs BP 131/84 (BP Location: Left Arm)   Pulse (!) 123   Temp (!) 100.6 F (38.1 C) (Oral)   Resp 18   Ht 5' 3"  (1.6 m)   Wt 59 kg   LMP 04/18/2018   SpO2 97%   BMI 23.03 kg/m   Physical Exam Vitals and nursing note reviewed. Exam conducted with a chaperone present.  Constitutional:      General: She is not in acute distress.    Appearance: Normal appearance.  HENT:     Head: Normocephalic and atraumatic.  Eyes:     General: No scleral icterus.    Extraocular Movements: Extraocular movements intact.     Pupils: Pupils are equal,  round, and reactive to light.  Cardiovascular:     Rate and Rhythm: Regular rhythm. Tachycardia present.  Pulmonary:     Effort: Pulmonary effort is normal.     Breath sounds: Normal breath sounds.  Skin:    Coloration: Skin is not jaundiced.  Neurological:     Mental Status: She is alert. Mental status is at baseline.     Coordination: Coordination normal.    ED Results / Procedures / Treatments   Labs (all labs ordered are listed, but only abnormal results are displayed) Labs Reviewed  RESP PANEL BY RT-PCR (FLU A&B, COVID) ARPGX2 - Abnormal; Notable for the following components:      Result Value   SARS Coronavirus 2 by RT PCR POSITIVE (*)    All other components within normal limits  BASIC METABOLIC PANEL - Abnormal; Notable for the following components:   Sodium 134 (*)    Glucose, Bld 114 (*)    All other components within normal limits  CBC WITH  DIFFERENTIAL/PLATELET - Abnormal; Notable for the following components:   RBC 3.33 (*)    Hemoglobin 10.8 (*)    HCT 31.8 (*)    All other components within normal limits    EKG None  Radiology DG Chest Portable 1 View  Result Date: 03/14/2021 CLINICAL DATA:  Weakness EXAM: PORTABLE CHEST 1 VIEW COMPARISON:  None. FINDINGS: The heart size and mediastinal contours are within normal limits. Both lungs are clear. The visualized skeletal structures are unremarkable. IMPRESSION: No active disease. Electronically Signed   By: Donavan Foil M.D.   On: 03/14/2021 22:46    Procedures Procedures   Medications Ordered in ED Medications  acetaminophen (TYLENOL) 325 MG tablet (has no administration in time range)  sodium chloride 0.9 % bolus 1,000 mL (has no administration in time range)  acetaminophen (TYLENOL) tablet 650 mg (650 mg Oral Given 03/14/21 2128)    ED Course  I have reviewed the triage vital signs and the nursing notes.  Pertinent labs & imaging results that were available during my care of the patient were reviewed by me and considered in my medical decision making (see chart for details).  Clinical Course as of 03/14/21 2344  Tue Mar 14, 2021  2324 DG Chest Portable 1 View No PNA, pleural effusion, or PTX [HS]  2324 Resp Panel by RT-PCR (Flu A&B, Covid) Nasopharyngeal Swab(!) Fever and tachycardia are likely to due to COVID positive status. Patient is not interested in paxlovid [HS]  2341 CBC with Differential(!) Patient is mildly anemic.  I do not SPECT a GI bleed, but I will make sure she follows up with her primary care doctor regarding this. [HS]    Clinical Course User Index [HS] Sherrill Raring, PA-C   MDM Rules/Calculators/A&P                           Patient is mildly tachycardic, I suspect suspect this is due to the fever.  She was seen at the urgent care yesterday for anaphylaxis, but I do not suspect that at the moment.  She is not having any throat swelling,  chest pain, shortness of breath, hives.  She has a little bit flushed in the face.  I will check basic labs and evaluate her for COVID-19 as well.  We will also get a chest x-ray to rule out pneumonia.  Lab work-up has been reassuring.  Patient is COVID-positive I suspect this is what is  causing her symptoms.  She is not interested in Paxil Oved, so we will discharge her with symptomatic management.  Return precautions discussed.  Please see ED course for interpretation of labs and imaging. Final Clinical Impression(s) / ED Diagnoses Final diagnoses:  COVID-19  Anemia, unspecified type    Rx / DC Orders ED Discharge Orders     None        Sherrill Raring, PA-C 03/14/21 Paulding, Adam, DO 03/15/21 1523

## 2021-03-14 NOTE — ED Triage Notes (Signed)
C/o bee sting yesterday, seen at [facility] , given prednisone, reports fever today , body aches

## 2022-08-15 IMAGING — DX DG CHEST 1V PORT
1 series · 1 of 1 positions shown · non-contrast
Comparison: None.

CLINICAL DATA: Weakness

EXAM:
PORTABLE CHEST 1 VIEW

[chest ap]
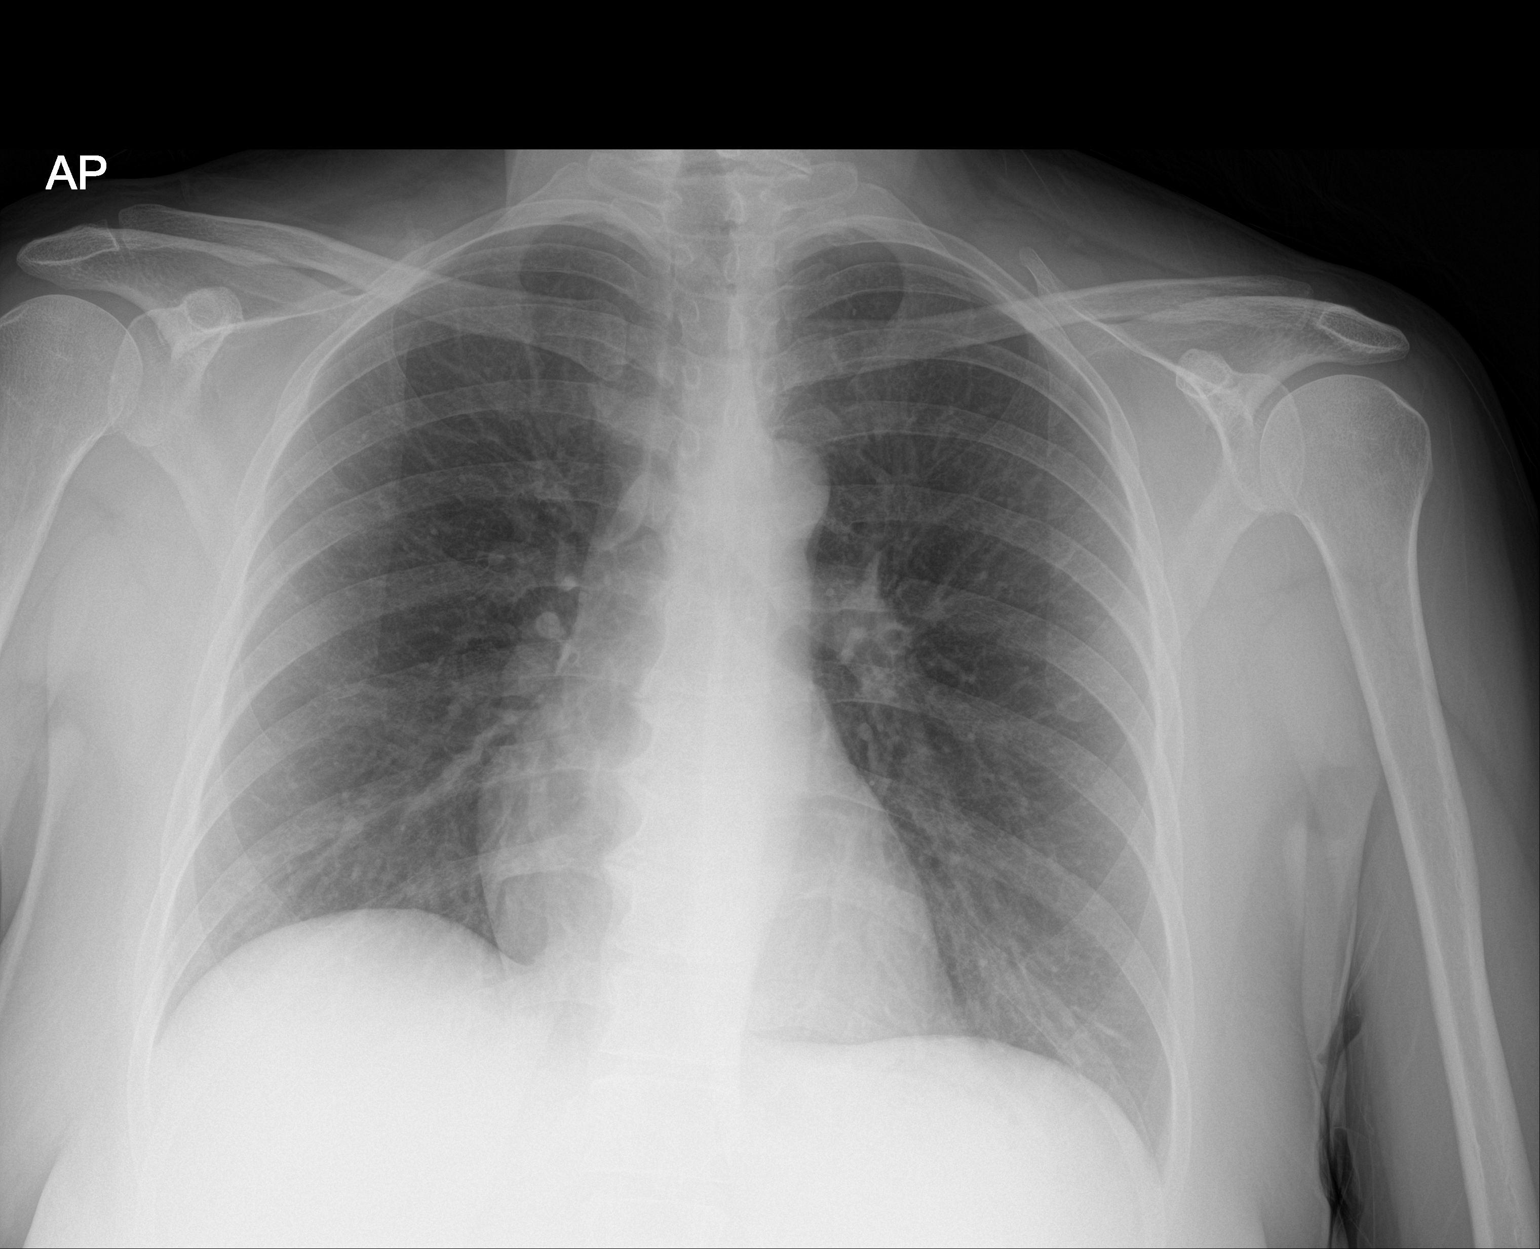

[1 of 1 positions shown; findings below may reference images not displayed]

FINDINGS: The heart size and mediastinal contours are within normal limits.
Both lungs are clear. The visualized skeletal structures are
unremarkable.
IMPRESSION: No active disease.
